# Patient Record
Sex: Male | Born: 1960 | Race: White | Hispanic: No | Smoking: Current every day smoker
Health system: Southern US, Community
[De-identification: ages and names within clinical notes are randomized; demographics above are authoritative.]

## PROBLEM LIST (undated history)

## (undated) DIAGNOSIS — F1910 Other psychoactive substance abuse, uncomplicated: Secondary | ICD-10-CM

## (undated) DIAGNOSIS — R296 Repeated falls: Secondary | ICD-10-CM

## (undated) DIAGNOSIS — S42202B Unspecified fracture of upper end of left humerus, initial encounter for open fracture: Secondary | ICD-10-CM

## (undated) DIAGNOSIS — K219 Gastro-esophageal reflux disease without esophagitis: Secondary | ICD-10-CM

## (undated) DIAGNOSIS — F329 Major depressive disorder, single episode, unspecified: Secondary | ICD-10-CM

## (undated) DIAGNOSIS — F32A Depression, unspecified: Secondary | ICD-10-CM

## (undated) DIAGNOSIS — S42209A Unspecified fracture of upper end of unspecified humerus, initial encounter for closed fracture: Secondary | ICD-10-CM

## (undated) DIAGNOSIS — Z8719 Personal history of other diseases of the digestive system: Secondary | ICD-10-CM

## (undated) DIAGNOSIS — R51 Headache: Secondary | ICD-10-CM

## (undated) DIAGNOSIS — R42 Dizziness and giddiness: Secondary | ICD-10-CM

## (undated) DIAGNOSIS — J189 Pneumonia, unspecified organism: Secondary | ICD-10-CM

## (undated) DIAGNOSIS — S92001A Unspecified fracture of right calcaneus, initial encounter for closed fracture: Secondary | ICD-10-CM

## (undated) DIAGNOSIS — G4700 Insomnia, unspecified: Secondary | ICD-10-CM

## (undated) DIAGNOSIS — T148XXA Other injury of unspecified body region, initial encounter: Secondary | ICD-10-CM

## (undated) DIAGNOSIS — R410 Disorientation, unspecified: Secondary | ICD-10-CM

## (undated) DIAGNOSIS — F419 Anxiety disorder, unspecified: Secondary | ICD-10-CM

## (undated) DIAGNOSIS — N200 Calculus of kidney: Secondary | ICD-10-CM

## (undated) DIAGNOSIS — K922 Gastrointestinal hemorrhage, unspecified: Secondary | ICD-10-CM

## (undated) DIAGNOSIS — M869 Osteomyelitis, unspecified: Secondary | ICD-10-CM

## (undated) HISTORY — DX: Unspecified fracture of upper end of left humerus, initial encounter for open fracture: S42.202B

## (undated) HISTORY — PX: HEEL SPUR SURGERY: SHX665

## (undated) HISTORY — DX: Unspecified fracture of upper end of unspecified humerus, initial encounter for closed fracture: S42.209A

## (undated) HISTORY — DX: Personal history of other diseases of the digestive system: Z87.19

## (undated) HISTORY — DX: Gastrointestinal hemorrhage, unspecified: K92.2

## (undated) HISTORY — DX: Other psychoactive substance abuse, uncomplicated: F19.10

## (undated) HISTORY — DX: Depression, unspecified: F32.A

## (undated) HISTORY — PX: SHOULDER SURGERY: SHX246

## (undated) HISTORY — DX: Repeated falls: R29.6

## (undated) HISTORY — DX: Major depressive disorder, single episode, unspecified: F32.9

---

## 2001-11-07 ENCOUNTER — Inpatient Hospital Stay (HOSPITAL_COMMUNITY): Admission: EM | Admit: 2001-11-07 | Discharge: 2001-11-12 | Payer: Self-pay | Admitting: Psychiatry

## 2002-07-01 ENCOUNTER — Inpatient Hospital Stay (HOSPITAL_COMMUNITY): Admission: EM | Admit: 2002-07-01 | Discharge: 2002-07-05 | Payer: Self-pay | Admitting: Psychiatry

## 2005-04-12 ENCOUNTER — Ambulatory Visit: Payer: Self-pay | Admitting: Psychiatry

## 2005-04-12 ENCOUNTER — Inpatient Hospital Stay (HOSPITAL_COMMUNITY): Admission: EM | Admit: 2005-04-12 | Discharge: 2005-04-17 | Payer: Self-pay | Admitting: Psychiatry

## 2005-04-15 ENCOUNTER — Ambulatory Visit: Payer: Self-pay | Admitting: Psychiatry

## 2005-04-22 ENCOUNTER — Inpatient Hospital Stay (HOSPITAL_COMMUNITY): Admission: EM | Admit: 2005-04-22 | Discharge: 2005-04-27 | Payer: Self-pay | Admitting: *Deleted

## 2005-04-22 ENCOUNTER — Emergency Department (HOSPITAL_COMMUNITY): Admission: EM | Admit: 2005-04-22 | Discharge: 2005-04-22 | Payer: Self-pay | Admitting: Emergency Medicine

## 2005-07-24 ENCOUNTER — Inpatient Hospital Stay (HOSPITAL_COMMUNITY): Admission: EM | Admit: 2005-07-24 | Discharge: 2005-07-27 | Payer: Self-pay | Admitting: Emergency Medicine

## 2005-07-29 ENCOUNTER — Inpatient Hospital Stay (HOSPITAL_COMMUNITY): Admission: RE | Admit: 2005-07-29 | Discharge: 2005-08-03 | Payer: Self-pay | Admitting: Psychiatry

## 2005-07-29 ENCOUNTER — Emergency Department (HOSPITAL_COMMUNITY): Admission: EM | Admit: 2005-07-29 | Discharge: 2005-07-29 | Payer: Self-pay | Admitting: Emergency Medicine

## 2005-07-30 ENCOUNTER — Ambulatory Visit: Payer: Self-pay | Admitting: Psychiatry

## 2005-08-09 ENCOUNTER — Inpatient Hospital Stay (HOSPITAL_COMMUNITY): Admission: EM | Admit: 2005-08-09 | Discharge: 2005-08-11 | Payer: Self-pay | Admitting: *Deleted

## 2009-02-04 ENCOUNTER — Ambulatory Visit (HOSPITAL_BASED_OUTPATIENT_CLINIC_OR_DEPARTMENT_OTHER): Admission: RE | Admit: 2009-02-04 | Discharge: 2009-02-04 | Payer: Self-pay | Admitting: Orthopedic Surgery

## 2009-02-04 ENCOUNTER — Observation Stay (HOSPITAL_COMMUNITY): Admission: EM | Admit: 2009-02-04 | Discharge: 2009-02-05 | Payer: Self-pay | Admitting: Orthopedic Surgery

## 2009-02-22 ENCOUNTER — Inpatient Hospital Stay (HOSPITAL_COMMUNITY): Admission: RE | Admit: 2009-02-22 | Discharge: 2009-02-24 | Payer: Self-pay | Admitting: Orthopedic Surgery

## 2010-04-03 LAB — TYPE AND SCREEN
ABO/RH(D): B NEG
Antibody Screen: NEGATIVE

## 2010-04-03 LAB — COMPREHENSIVE METABOLIC PANEL
BUN: 2 mg/dL — ABNORMAL LOW (ref 6–23)
CO2: 33 mEq/L — ABNORMAL HIGH (ref 19–32)
Calcium: 8 mg/dL — ABNORMAL LOW (ref 8.4–10.5)
Creatinine, Ser: 0.72 mg/dL (ref 0.4–1.5)
GFR calc Af Amer: >60 mL/min (ref >60)
GFR calc non Af Amer: >60 mL/min (ref >60)
Glucose, Bld: 110 mg/dL — ABNORMAL HIGH (ref 70–99)
Total Protein: 5.1 g/dL — ABNORMAL LOW (ref 6.0–8.3)

## 2010-04-03 LAB — POCT I-STAT, CHEM 8
BUN: 17 mg/dL (ref 6–23)
Calcium, Ion: 1.08 mmol/L — ABNORMAL LOW (ref 1.12–1.32)
HCT: 37 % — ABNORMAL LOW (ref 39.0–52.0)
Sodium: 138 mEq/L (ref 135–145)
TCO2: 31 mmol/L (ref 0–100)

## 2010-04-03 LAB — CBC
Hemoglobin: 10.5 g/dL — ABNORMAL LOW (ref 13.0–17.0)
MCHC: 34.4 g/dL (ref 30.0–36.0)
MCV: 93.4 fL (ref 78.0–100.0)
RBC: 3.26 MIL/uL — ABNORMAL LOW (ref 4.22–5.81)

## 2010-04-03 LAB — ABO/RH: ABO/RH(D): B NEG

## 2010-04-06 LAB — TYPE AND SCREEN: Antibody Screen: NEGATIVE

## 2010-04-06 LAB — POCT I-STAT 4, (NA,K, GLUC, HGB,HCT)
Glucose, Bld: 104 mg/dL — ABNORMAL HIGH (ref 70–99)
Hemoglobin: 10.2 g/dL — ABNORMAL LOW (ref 13.0–17.0)
Hemoglobin: 8.8 g/dL — ABNORMAL LOW (ref 13.0–17.0)
Potassium: 3.4 mEq/L — ABNORMAL LOW (ref 3.5–5.1)
Potassium: 3.5 mEq/L (ref 3.5–5.1)
Sodium: 137 mEq/L (ref 135–145)

## 2010-04-06 LAB — COMPREHENSIVE METABOLIC PANEL
CO2: 27 mEq/L (ref 19–32)
Calcium: 9.1 mg/dL (ref 8.4–10.5)
Creatinine, Ser: 0.89 mg/dL (ref 0.4–1.5)
GFR calc Af Amer: >60 mL/min (ref >60)
GFR calc non Af Amer: >60 mL/min (ref >60)
Glucose, Bld: 100 mg/dL — ABNORMAL HIGH (ref 70–99)
Sodium: 138 mEq/L (ref 135–145)
Total Protein: 6.6 g/dL (ref 6.0–8.3)

## 2010-04-06 LAB — CBC
MCHC: 34.9 g/dL (ref 30.0–36.0)
Platelets: 500 10*3/uL — ABNORMAL HIGH (ref 150–400)
RBC: 3.53 MIL/uL — ABNORMAL LOW (ref 4.22–5.81)
RDW: 17.1 % — ABNORMAL HIGH (ref 11.5–15.5)
WBC: 5.5 10*3/uL (ref 4.0–10.5)

## 2010-04-06 LAB — PROTIME-INR
INR: 1.1 (ref 0.00–1.49)
Prothrombin Time: 14.1 seconds (ref 11.6–15.2)

## 2010-04-06 LAB — APTT: aPTT: 49 seconds — ABNORMAL HIGH (ref 24–37)

## 2010-06-02 NOTE — Discharge Summary (Signed)
NAMEISAISH, [name] NO.:  [id number]   MEDICAL RECORD NO.:  [id number]          PATIENT TYPE:  IPS   LOCATION:  0305                          FACILITY:  [facility]   PHYSICIAN:  Jasmine Pang, M.D. DATE OF BIRTH:  [birth year]   DATE OF ADMISSION:  04/22/2005  DATE OF DISCHARGE:  04/27/2005                                 DISCHARGE SUMMARY   IDENTIFICATION:  50 year old single Caucasian male who was admitted on a  voluntary basis.   HISTORY OF PRESENT ILLNESS:  The patient had previously been detoxed and was  discharged.  When home, he resumed drinking starting April 19, 2005.  He has  been depressed and anxious in addition to the alcohol abuse.  He had a  bottle of Xanax and was using these as well.  He states he feels he needs  help due to his anxiety.  He has been having thought to hang himself on a  basketball goal at home.  He has also been having thoughts of shooting  himself.  There have been no previous attempts.   PAST PSYCHIATRIC HISTORY:  Second [facility] admission, did not complete followup  after just being discharged from this unit.  He was supposed to be admitted  to [facility].   SUBSTANCE ABUSE HISTORY:  Drinking alcohol for many years, history of  abusing Xanax, pattern unclear. For further psychiatric admission  information see psychiatric admit note.   PHYSICAL EXAMINATION:  This was done at [facility] and was grossly  within normal limits..   ADMISSION LABORATORIES:  CBC was grossly within normal limits except for a  slightly elevated platelet count of 405 (150 to 400).  Urine drug screen was  positive for benzodiazepines.  Blood alcohol level was 161 (0-10).  Basic  chem panel was grossly within normal limits.  Potassium was slightly  decreased at 3.1 (3.5-5.1).  A repeat potassium was 3.9.  RPR was  nonreactive.  TSH was grossly within normal limits.  Hepatic profile was  grossly within normal limits.   HOSPITAL COURSE:  Upon admission, the  patient was restarted on the low-dose  Librium protocol.  He was also started on Protonix 40 mg daily on April 23, 2005.  On April 23, 2005 he was begun on K-Dur 20 mEq b.i.d. x2 days.  The  repeat potassium level was within normal limits. At 3.9.  On April 24, 2005,  Ambien which had been 10 mg on admission was increased to 20 mg due to  continued difficulty sleeping.   The patient was compliant with all unit groups and therapies and activities.  He continued to be very anxious.  He stated he wanted to be in treatment but  he was worried he would lose his job.  His sponsor was coming to visit him  while he was in the hospital.  He was anxious that I fill out the [facility]  papers for him.  He continued to be anxious throughout the hospitalization  however did well on his detox.  There were some symptoms of withdrawal,  including feeling shaky inside,  but overall he was able to have minimal  symptoms.  He talked about his plans to go home to live with his mother once  he left.  On the day of discharge, his mental status had improved and he was  less depressed and less anxious.  He was not suicidal or homicidal.  He was  having no auditory or visual hallucinations, no paranoia, no delusions.  His  thought processes were logical and goal directed.  Thought content no  predominant theme.  He was felt to be safe to be transitioned home to an  outpatient program.   DISCHARGE DIAGNOSES:  AXIS I:  Depressive disorder not otherwise specified,  alcohol dependence, benzodiazepine dependence.  AXIS II:  None.  AXIS III:  Gastroesophageal reflux disease.  AXIS IV:  Unable to work at present which is very distressing for him.  AXIS V:  Global assessment of functioning upon admission was 25, global  assessment of functioning upon discharge was 55, global assessment of  functioning highest past year was 68.   DISCHARGE INSTRUCTIONS:  Three were no specific activity level or dietary  restrictions.    DISCHARGE MEDICATIONS:  1.  Vistaril 50 mg q.6h p.r.n. anxiety (he states this has been very helpful      for him in the past).  2.  Ambien 10 mg 1-2 pills at bedtime.Marland Kitchen   POST HOSPITAL CARE PLAN:  The patient will return to the outpatient [facility]  treatment in [facility] that was arranged for him on the last  discharge.      Jasmine Pang, M.D.  Electronically Signed     BHS/MEDQ  D:  04/27/2005  T:  04/27/2005  Job:  [id number]

## 2010-06-02 NOTE — H&P (Signed)
NAME:  [name], [name]                    ACCOUNT NO.:  [id number]   MEDICAL RECORD NO.:  [id number]                   PATIENT TYPE:  IPS   LOCATION:  0301                                 FACILITY:  [facility]   PHYSICIAN:  Jeanice Lim, M.D.              DATE OF BIRTH:  April 06, 1960   DATE OF ADMISSION:  07/01/2002  DATE OF DISCHARGE:  07/05/2002                         PSYCHIATRIC ADMISSION ASSESSMENT   IDENTIFYING INFORMATION:  The patient is a 50 year old divorced white male  involuntarily committed on July 01, 2002.   HISTORY OF PRESENT ILLNESS:  The patient was admitted to [facility].  He was unable to stop drinking.  He was having withdrawal symptoms.  His last drink was on the day prior to this admission.  He had been on an  alcohol binge for the past two days, feeling very depressed.  He does not  want to live.  He feels very irritable.  He quit working.  He states he has  lost everything.  He denies any suicide attempts in the past.  He denies any  auditory and visual hallucinations.  He states that he will leave our  facility if he cannot get what he needs.   PAST PSYCHIATRIC HISTORY:  He was admitted at [facility] for  detoxification in January 2004.  He sees Hinton Lovely, M.D., at mental  health for outpatient treatment.   SUBSTANCE ABUSE HISTORY:  He smokes to packs a day.  He has been two to  three pints of alcohol daily.  Last drink was on 07/01/02.   PAST MEDICAL HISTORY:  Primary care Masao Junker: None.  Medical problems: None.   MEDICATIONS:  He has been on Lexapro 10 mg daily.   DRUG ALLERGIES:  No known allergies.   PHYSICAL EXAMINATION:  GENERAL:  Physical examination was done at [facility].  He appears in no acute distress today.   LABORATORY DATA:  CBC: RBC is mildly low at 4.39, hematocrit is 41.5, MCV  95.  Urine drug screen was negative.  CMET: Glucose 72.  Alcohol level was  0.26.   SOCIAL HISTORY:  He is a  50 year old divorced white male, married first  time.  He has two sons ages 63 and 75.  He lives with his children his wife.  He recently quit his work.  He would not elaborate as to why that is.  He  denies any legal issues.   FAMILY HISTORY:  None.   MENTAL STATUS EXAM:  He is an alert, passively cooperative.  The patient had  a sudden need to leave the interview room for a short time but returned  shortly after he left.  He dresses casually.  He has a good eye contact.  Speech is clear.  The patient feels very anxious.  He appears very  irritable.  Thought processes are coherent.  There is no evidence of  psychosis, no auditory hallucinations.  He does  not appear to be responding  to internal stimuli.  Cognitive and memory are intact.  Poor judgment.  Poor  impulse control.  Limited to poor insight.    ADMISSION DIAGNOSES:   AXIS I:  1. Alcohol dependence.  2. Opiate abuse, in partial remission.  3. Depressive disorder, not otherwise specified.   AXIS II:  Deferred.   AXIS III:  None.   AXIS IV:  Problems with primary support group, occupation, economic, other  psychosocial problems.   AXIS V:  Current is 35, this past year is 71.   INITIAL PLAN OF CARE:  Plan is a involuntary commitment to [facility] for alcohol dependence.  Contract for safety, check every 15 minutes.  Will initiate the detoxification protocol.  Continue an antidepressant,  increase dosage to decrease depressive symptoms.  The patient is to attend  groups, hoping to increase his coping skills.  Case worker is to look at  rehabilitation programs.  The patient is to attend AA meetings for continued  support and follow up with mental health.  The patient is to be medication  compliant.  Our long-term goal is for the patient to remain alcohol and drug  free.   ESTIMATED LENGTH OF STAY:  Three to five days.     Landry Corporal, N.P.                       Jeanice Lim, M.D.    JO/MEDQ   D:  07/27/2002  T:  07/27/2002  Job:  [id number]

## 2010-06-02 NOTE — H&P (Signed)
[name], [name] NO.:  [id number]   MEDICAL RECORD NO.:  [id number]          PATIENT TYPE:  IPS   LOCATION:  0502                          FACILITY:  [facility]   PHYSICIAN:  Jasmine Pang, M.D. DATE OF BIRTH:  11-Apr-1960   DATE OF ADMISSION:  08/09/2005  DATE OF DISCHARGE:                         PSYCHIATRIC ADMISSION ASSESSMENT   IDENTIFYING INFORMATION:  A 50 year old divorced white male, involuntarily  committed on August 08, 2005.   HISTORY OF PRESENT ILLNESS:  The patient presents with a history of alcohol  dependence.  The patient was just discharged from [facility] less  than a week ago.  He states when he went home he found out he lost his job,  he became depressed and began drinking again.  The patient has a long  history of drinking, has been drinking since the age 59.  He has been  drinking a case of beer daily.  The patient is here on petition.  The  patient was petitioned per his mother.  The patient also reports a history  of mood swings and was noncompliant with his medications.  The patient  states that he could benefit from going to a long term rehab program.  The  patient reports depression but denies any suicidal thoughts and denies any  psychotic symptoms.   PAST PSYCHIATRIC HISTORY:  The patient has had multiple admissions to  [facility] for alcohol detox.  He is sponsored by [facility]  for 7 days.   SOCIAL HISTORY:  He is a 50 year old divorced white male, lives with his  mother.  He lost his job recently.  Has an 8th grade education.   FAMILY HISTORY:  Denies.   ALCOHOL DRUG HISTORY:  The patient smokes 2 packs a day.  Alcohol habits as  described above.  No seizures.  No drug use.   PAST MEDICAL HISTORY:  Primary care Stuti Sandin is unknown.  Medical problems:  The patient denies any acute or chronic medical problems.   MEDICATIONS:  The patient was discharged on Vistaril and Neurontin.   DRUG ALLERGIES:  No  known allergies.   PHYSICAL EXAMINATION:  The patient was assessed at [facility].  This  is a middle-aged male, complaining of anxiety, appears in no acute distress.  His temperature is 98.4, 105 heart rate, 16 respirations, blood pressure is  145/89, 5 feet 8 inches tall, 137 pounds.  In the emergency room the patient  received IV fluids, Ativan and Valium IV.   LABORATORY DATA:  Urine drug screen is positive for benzodiazepines,  positive for barbiturates.  Alcohol level was 220, BUN was 8.  Liver enzymes  were within normal limits.   MENTAL STATUS EXAM:  He is fully alert, in the bed, good eye contact.  Speech is clear, normal rate and tone.  The patient feels very anxious,  affect is restricted.  The patient is requesting phenobarbital injections  and stating don't leave him alone to die there.  Thought processes and  coherent.  There is no evidence of psychosis.  Cognitive function intact.  Memory is fair, judgment is  poor, insight is fair, poor impulse control.   ADMISSION DIAGNOSES:  AXIS I:  Alcohol dependence, depressive disorder not  otherwise specified.  AXIS II:  Deferred.  AXIS III:  None.  AXIS IV:  Problems with occupation, other psychosocial problems related to  chronic substance abuse.  AXIS V:  Current is 35.   PLAN:  Plan is to detox the patient with Librium protocol, work on relapse  prevention, monitor withdrawal symptoms.  We will resume his Neurontin and  have Vistaril available, also some Seroquel for some mild agitation.  The  patient may consider a mood stabilizer.  The patient reports being on  Depakote in the past.  Case manager to assess any long-term rehab programs  available.   TENTATIVE LENGTH OF CARE:  5-6 days.      Landry Corporal, N.P.      Jasmine Pang, M.D.  Electronically Signed    JO/MEDQ  D:  08/09/2005  T:  08/09/2005  Job:  [id number]

## 2010-06-02 NOTE — Discharge Summary (Signed)
[name], [name] NO.:  [id number]   MEDICAL RECORD NO.:  [id number]          PATIENT TYPE:  INP   LOCATION:  1514                         FACILITY:  [facility]   PHYSICIAN:  Deirdre Peer. Polite, M.D. DATE OF BIRTH:  28-Oct-1960   DATE OF ADMISSION:  07/24/2005  DATE OF DISCHARGE:  07/27/2005                                 DISCHARGE SUMMARY   DISCHARGE DIAGNOSES:  1.  Alcohol intoxication, treated with withdrawal protocol.  Please note      alcohol level 139 on admission.  2.  History of benzodiazepines abuse.  3.  Hypokalemia, resolved.  4.  Reported history of dark emesis and dark stools, hemoccult positive.      Hemoglobin stable throughout hospitalization of 14, no recurrent      episodes of dark emesis or dark stools in the hospital, hemodynamically      stable throughout.  For further outpatient follow up with Dr. Wandalee Ferdinand      of [facility].  Number has been provided to the patient.   DISCHARGE MEDICATIONS:  1.  Librium 25 mg 1 q.12h. x 1 day, then 1 daily x1 day, then stop.  2.  Protonix 40 mg 1 q.12h. daily.  3.  Multivitamin daily.  4.  The patient is advised to avoid NSAIDs.  5.  The patient has been advised to avoid alcohol.  6.  The patient has been advised to call [facility] for outpatient follow up,      reported bloody emesis and stools.   CONSULTANTS:  None.   DISPOSITION:  The patient is being discharged to home in stable condition.  Further outpatient follow up with [facility] and his primary care MD of his  choosing.   DATA:  CBC:  White count 5.7, hemoglobin 14.6, platelets 198, BMET within  normal limits.  Hemoccult positive.  Alcohol level on admission 139.  Urine  drug screen positive for benzodiazepines.   HISTORY OF PRESENT ILLNESS:  A 50 year old male with multiple medical  problems presented to the hospital with chief complaint of abdominal pain,  nausea, vomiting, and bloody emesis of dark-colored material as well as dark  stool.   In the ED, the patient was evaluated.  Admission was deemed  necessary for further evaluation and treatment.  Please see the dictated H&P  for further details.   PAST MEDICAL HISTORY:  As stated above.   MEDICATIONS ON ADMISSION:  Per admission H&P.   SOCIAL HISTORY:  Per admission H&P.   HOSPITAL COURSE:  The patient was admitted to a medicine floor bed for  further evaluation and treatment of EtOH induced gastritis.  The patient  treated with IV fluids, started on withdrawal protocol as well as proton  pump inhibitor.  The patient had serial H&H.  The patient's initial  hemoglobin on admission was 17.  Follow up was 14.  It was felt that the  decrease is secondary to hemodilution effect.  The patient's stools were  monitored during his hospitalization and did not have any bloody stools.  Stools were hemoccult positive.  There was  no emesis.  The patient was  tolerating a full p.o. diet without recurrent emesis or abdominal pain.  On  July 13, the patient continued to have a stable hemoglobin of 14 without  signs of withdrawal.  The patient was felt to be stable for discharge.  [facility] has been contacted and will have outpatient follow up with the  patient in reference to the reported history of dark emesis and heme-  positive stools.  At this time, this is felt to be EtOH induced gastritis.      Deirdre Peer. Polite, M.D.  Electronically Signed     RDP/MEDQ  D:  07/27/2005  T:  07/27/2005  Job:  [id number]   cc:   Graylin Shiver, M.D.  Fax: [phone]

## 2010-06-02 NOTE — Discharge Summary (Signed)
NAMEKEYLAN, COSTABILE NO.:  [id number]   MEDICAL RECORD NO.:  [id number]          PATIENT TYPE:  IPS   LOCATION:  0502                          FACILITY:  [facility]   PHYSICIAN:  Geoffery Lyons, M.D.      DATE OF BIRTH:  1960/03/05   DATE OF ADMISSION:  07/29/2005  DATE OF DISCHARGE:  08/03/2005                                 DISCHARGE SUMMARY   CHIEF COMPLAINT/PRESENT ILLNESS:  This was one of several admissions to  [facility] for this 50 year old single male, voluntarily  admitted, with a history of alcohol dependence, drinking heavily for the  last two weeks.  He was recently discharged from the hospital for lower GI  bleeding (discharged July 13), drinking too much liquor, last drink July  15.  He was using Xanax to help control his anxiety.   PAST PSYCHIATRIC HISTORY:  Prior detox.  Longest history of sobriety three  months.  Alcohol and/or drug history:  Persistent use of alcohol as already  stated, more recently using Xanax.   MEDICAL HISTORY:  Noncontributory other than for lower GI bleeding.   MEDICATIONS:  None prescribed.   PHYSICAL EXAMINATION:  Performed, failed to show any acute findings.   LABORATORY DATA:  On laboratory workups, blood chemistry:  Sodium 140,  potassium 4.1, glucose 81, BUN 17, creatinine 1.1.  CBC:  White blood cells  7.4, hemoglobin 15.1.  Drug screening:  Benzodiazepines positive.   MENTAL STATUS EXAM:  Upon admission revealed an alert, cooperative male in  no acute distress, in bed, somewhat uncooperative.  Fair eye contact.  Speech normal rate, tone, and production.  Mood:  Feeling shaky.  Affect  constricted.  Thought process logical, coherent and relevant, somatically  focused, and there are no acute suicidal or homicidal ideas.  No evidence of  delusions.  No hallucinations.  Cognition well preserved.   ADMISSION DIAGNOSIS:  AXIS I:  Alcohol dependence, anxiety disorder not  otherwise specified.  AXIS II:   No diagnosis.  AXIS III:  No diagnosis.  AXIS IV:  Moderate.  AXIS V:  Upon admission 35, highest global assessment of functioning in the  last year 60.   COURSE IN THE HOSPITAL:  He was admitted.  He was started in milieu and  psychotherapy.  He initially endorsed that detox, he does better with  phenobarbital.  Phenobarbital had to be supplemented with Librium.  He  continuously complained of being shaky.  The clinical call was between  anxiety and actual withdrawal.  We worked with Neurontin.  We worked with  Vistaril.  Eventually we established Neurontin at 300 three times a day.  He  did endorse that he relapsed after almost there to four weeks of sobriety.  He cannot say why, maybe some issues having to do with other people, places.  Very vague, very reserved, non-spontaneously.  He claimed shakiness.  He  requested going to a longer term treatment facility, and there were some  headaches.  We continued to detox, continued to have difficulty with  shakiness.  Objectively, his blood pressure and pulse  were within normal  limits.  The focus was wanting to go to a residential treatment center.   We continued to detox with phenobarbital and Librium.  We continued to  address the anxiety, helped him with some Neurontin as well as Vistaril.   By July 19, he understood he was not going to be able to make it to a  residential treatment center.  He was going to have to go back to work.  He  was willing to try the Neurontin and give the Neurontin a longer time as he  had seen some benefit.  On July 20, he was in full contact with reality.  There were no suicidal or homicidal ideas, no hallucinations, no delusions.  Objectively he was better, decrease in anxiety, and it was noted he was  committed to abstinence.   DISCHARGE DIAGNOSIS:  AXIS I:  Alcohol dependence; anxiety disorder not  otherwise specified.  AXIS II:  No diagnosis.  AXIS III:  No diagnose.  AXIS IV:  Moderate.  AXIS V:   Upon discharge, 50.   DISCHARGE MEDICATIONS:  He was discharged on:  1. Protonix 40 mg per day.  2. Neurontin 300 three times a day.  3. Vistaril 25, one every six hours as needed for anxiety.   FOLLOWUP:  At [facility].      Geoffery Lyons, M.D.  Electronically Signed     IL/MEDQ  D:  08/25/2005  T:  08/26/2005  Job:  [id number]

## 2010-06-02 NOTE — Discharge Summary (Signed)
NAME:  [name], [name]                    ACCOUNT NO.:  [id number]   MEDICAL RECORD NO.:  [id number]                   PATIENT TYPE:  IPS   LOCATION:  0301                                 FACILITY:  [facility]   PHYSICIAN:  Jeanice Lim, M.D.              DATE OF BIRTH:  December 01, 1960   DATE OF ADMISSION:  07/01/2002  DATE OF DISCHARGE:  07/05/2002                                 DISCHARGE SUMMARY   IDENTIFYING DATA:  This is a 50 year old divorced Caucasian male  involuntarily admitted reporting that he cannot stop drinking, had been  binging and had lost everything.  Had been using other drugs.  Was not a  reliable historian.   PAST PSYCHIATRIC HISTORY:  Had been in several other hospitals in the last  two months for detox.   MEDICATIONS:  The patient, reportedly, was supposed to take Lexapro 10 mg  daily but had been noncompliant.   ALLERGIES:  No known drug allergies.   PHYSICAL EXAMINATION:  Essentially within normal limits.  Neurologically  nonfocal.   LABORATORY DATA:  Urine drug screen negative.  Alcohol level 0.26.   MENTAL STATUS EXAM:  Alert and passively cooperative with sudden need to  leave interview.  Good eye contact.  Speech clear.  Mood anxious, irritable.  Thought processes goal directed.  Thought content negative for dangerous  ideation or psychotic symptoms.  Poor impulse control.  Judgment and  insight, patient denied acute dangerous ideation.   ADMISSION DIAGNOSES:   AXIS I:  1. Alcohol dependence.  2. Opiate abuse.  3. Depressive disorder not otherwise specified.   AXIS II:  Deferred.   AXIS III:  None.   AXIS IV:  Moderate (problems with primary support group).   AXIS V:  35/60.   HOSPITAL COURSE:  The patient was admitted and ordered routine p.r.n.  medications and underwent further monitoring.  Was encouraged to participate  in individual, group and milieu therapy.  The patient was placed on a detox  protocol and monitored.  The patient  was somewhat med-seeking, quite  agitated, threatening at times.  Wanted detox protocol changed to Librium.  Felt phenobarbital was not working.  Continued to be somewhat threatening  and would not follow hospital safety rules including not smoking in the  room.  The patient was warned multiple times, continued to smoke, warned he  would be discharged if he continued to smoke.  On July 05, 2002, reported no  suicidal ideation, homicidal ideation, no psychotic symptoms, positive mild  withdrawal symptoms which are improving and some craving but no acute safety  issues.  The patient was later discharged that evening, when he continued to  smoke in his room despite safety warnings.  The patient was discharged at  that time due to noncompliance with treatment, lack of further indication  for inpatient treatment, not cooperative with program.  No acute safety  issues regarding suicidal or homicidal ideation or psychotic symptoms.  CONDITION ON DISCHARGE:  The patient was discharged in improved condition  and was actually happy to leave.   DISCHARGE MEDICATIONS:  1. Lexapro 20 mg q.a.m.  2. Seroquel 25 mg b.i.d., 100 mg q.h.s.  3. Protonix 40 mg q.a.m.  4. Neurontin 200 mg q.i.d.   FOLLOW UP:  The patient is to follow up at [facility]  on Wednesday, July 08, 2002 at 10:15 a.m.   DISCHARGE DIAGNOSES:   AXIS I:  1. Alcohol dependence.  2. Opiate abuse.  3. Depressive disorder not otherwise specified.   AXIS II:  Deferred.   AXIS III:  None.   AXIS IV:  Moderate (problems with primary support group).   AXIS V:  Global Assessment of Functioning on discharge 55.                                               Jeanice Lim, M.D.    JEM/MEDQ  D:  07/29/2002  T:  07/30/2002  Job:  [id number]

## 2010-06-02 NOTE — H&P (Signed)
NAMECHRISTIAN, [name] NO.:  [id number]   MEDICAL RECORD NO.:  [id number]          PATIENT TYPE:  IPS   LOCATION:  0305                          FACILITY:  [facility]   PHYSICIAN:  Anselm Jungling, MD  DATE OF BIRTH:  09/25/1960   DATE OF ADMISSION:  04/12/2005  DATE OF DISCHARGE:                         PSYCHIATRIC ADMISSION ASSESSMENT   IDENTIFYING INFORMATION:  A 50 year old divorced white male, voluntarily  admitted on April 12, 2005.   HISTORY OF PRESENT ILLNESS:  The patient presents with a history of alcohol  abuse.  He states he drinks every day, drinking vodka and beer, up to a pint  or more or 6-pack or more.  He has been drinking since 50 years of age.  His  longest history of sobriety has been 2 years.  The patient states he  relapsed about 2 weeks ago.  He has been drinking in the morning.  Denies  any blackouts, denies any depression or suicidal thoughts.  He reports that  he has had some falls.  They do not appear to be any recent falls.  Denies  any other drug use.   PAST PSYCHIATRIC HISTORY:  The patient was here a few years ago.  Denies any  suicide attempts or current outpatient mental health treatment.   SOCIAL HISTORY:  He is a 50 year old divorced white male and has 2 children,  ages 80 and 34.  He lives with his mother.  The patient states he works,  occupation is unclear.   FAMILY HISTORY:  Denies.   ALCOHOL DRUG HISTORY:  The patient smokes.  He denies any seizures, denies  any drug use.  Alcohol habits as described above.   PAST MEDICAL HISTORY:  Primary care Melroy Bougher is Dr. Jodelle Gross in [place].  Medical problems are none.   MEDICATIONS:  None.   DRUG ALLERGIES:  No known allergies.   PHYSICAL EXAMINATION:  The patient was physically assessed at [facility] where he received Ativan and some IV fluids.  He does present with  a bruise to his right forearm.  He is a thin, unkempt male.  His review of  systems:  No fever, no  chills, no nausea and vomiting, no insomnia, some  reported weight loss, no dysuria, positive for falls.  No seizures, no  blurred vision, positive for bruising.  Temperature is 96.8, pulse of 116,  respiratory rate of 16, blood pressure 141/78.  He is 97% saturated.  RBCs  is 4.9.  MCV is 99, BUN is 5, alcohol level was 250 on arrival to emergency  room.  Drug screen was negative.   MENTAL STATUS EXAM:  He is a fully alert, anxious appearing middle-aged  male.  He is somewhat unkempt.  Speech is clear, although he provides little  information.  The patient feels anxious, patient appears anxious as well.  The patient did get almost before interview was over, walked out without any  explanation.  Thought processes are coherent, no evidence of any psychosis,  cognitive function intact.  Memory is fair, judgment is fair, poor impulse  control.  He is a  poor historian.   ADMISSION DIAGNOSES:  AXIS I:  Alcohol abuse.  AXIS II:  Deferred.  AXIS III:  None.  AXIS IV:  Psychosocial problems related to chronic alcohol use.  AXIS V:  Current is 45.   PLAN:  Plan is to detox with low-dose Librium, offer continuous fluids,  monitor withdrawal symptoms.  Case manager is to look at any potential rehab  program available. The patient is to increase coping skills and attend group  therapy.   TENTATIVE LENGTH OF CARE:  4-5 days.      Landry Corporal, N.P.      Anselm Jungling, MD  Electronically Signed    JO/MEDQ  D:  04/13/2005  T:  04/15/2005  Job:  [id number]

## 2010-06-02 NOTE — Discharge Summary (Signed)
NAMEJERET, [name] NO.:  [id number]   MEDICAL RECORD NO.:  [id number]          PATIENT TYPE:  IPS   LOCATION:  0502                          FACILITY:  [facility]   PHYSICIAN:  Jasmine Pang, M.D. DATE OF BIRTH:  July 10, 1960   DATE OF ADMISSION:  08/08/2005  DATE OF DISCHARGE:  08/11/2005                                 DISCHARGE SUMMARY   IDENTIFICATION:  This is a 50 year old divorced Caucasian male who was  admitted on an involuntary basis to my service on August 08, 2005.   HISTORY OF PRESENT ILLNESS:  The patient presents with a history of alcohol  dependence.  He was just discharged from [facility] less than a week  ago.  He states that he went home and found out that he lost his job.  He  became depressed and began drinking again.  The patient has a long history  of drinking since the age of 15.  He drinks a case of beer daily.  The  patient is here on petition.  He was petitioned by his mother who was  worried about his mental health.  The patient also reports mood swings and  noncompliance with his medications.  He states that he could benefit from  going into long-term rehab.  He reports depression but denies any suicidal  thoughts.  Denies any psychotic symptoms.   PAST PSYCHIATRIC HISTORY:  The patient has had multiple admissions to  [facility] for alcohol detox.   FAMILY HISTORY:  Denies.   ALCOHOL/DRUG HISTORY:  The patient smokes two packs per day.  Alcohol habits  as described above.  No seizures.  No drug use.   PAST MEDICAL HISTORY:  The patient denies any acute or chronic medical  problems.   MEDICATIONS:  The patient was discharged on Vistaril and Neurontin.   ALLERGIES:  No known drug allergies.   PHYSICAL EXAMINATION:  The patient was assessed at [facility]  Emergency Department.  He was a middle-aged male complaining of anxiety,  appearing in acute distress.  His temperature was 98.4, heart rate 105,  respirations 16, pulse 148/95, height 5 feet tall.  He weighed 137 pounds.  In the emergency room, the patient received IV fluids, Ativan and Valium.   LABORATORY DATA:  Urine drug screen is positive for benzodiazepines and  positive for barbiturates.  Alcohol level was 220.  BUN 8.  Liver enzymes  were within normal limits.  RPR done through the [facility] was  reactive.  The patient had to be given penicillin G benzathine 2.4 million  units IM x1.   HOSPITAL COURSE:  Upon admission, the patient was placed on the Librium  detox protocol.  He was also placed on Ambien 10 mg p.o. q.h.s. p.r.n.  insomnia.  On August 09, 2005, the patient was placed on Neurontin 100 mg p.o.  t.i.d., Seroquel 25 mg p.o. q.6h. p.r.n.  Was advised that nursing staff  should encourage fluids.  The patient was also placed on Vistaril 50 mg p.o.  q.6h. p.r.n. anxiety or agitation.  On August 10, 2005,  the patient was  started on Protonix 40 mg p.o. q.d.  On August 10, 2005, the patient's RPR was  found to be reactive.  He was therefore placed on penicillin G 2.4 millions  units IM x1.  The patient tolerated his medications well with no significant  side effects.   Upon admission, when I first met with patient, he was lying in his bed and  not participating in groups.  He stated he felt too tremulous on the inside  to be able to do that.  He stated he had just gotten out of this hospital  and relapsed after he found out his job was gone.  He got fired because he  had been undependable.  He was also interested in starting lithium  carbonate.  He stated he had not been on this before and he felt it would be  helpful for his mood cycling.  On August 10, 2005, the patient stated he was  not doing well.  He was shaking, tired.  He states he hurts all over.  He  feels the medicine is not working.  He was advised to have to give it more  time.  He was very depressed.  He just wanted to stay in bed.  He wanted   phenobarbital or Librium.  I advised he cannot use these medications on a  regular as-needed basis.  It was also found out that day that his RPR done  at the [facility] Emergency Department was reactive.  Subsequently,  he was given penicillin as indicated in the first paragraph.  On August 11, 2005, the patient felt ready for discharge.  He states he wants help.  He  discussed his lack of follow through in the past.  He understands he will  have to go through [facility] for follow-up.  His mental status had  improved.  The patient was more cooperative and attentive as we talked,  engaging well.  The mood was less depressed, less anxious.  The affect wide  range.  No suicidal or homicidal ideation.  Thoughts logical and goal  directed.  Thought content with no predominant theme.   DISCHARGE DIAGNOSES:  AXIS I:  Alcohol dependence.  Features of major  depression.  AXIS II:  None.  AXIS III:  Positive RPR, treated with a onetime dose of penicillin G.  AXIS IV:  Severe (problems with occupation, other psychosocial problems  related to chronic substance abuse).  AXIS V:  GAF current 45; GAF on admission 35; GAF highest past year 55.   ACTIVITY/DIET:  There were no specific activity level or dietary  restrictions other than the patient needs to eat correctly.   DISCHARGE MEDICATIONS:  1.  Librium 25 mg at bedtime today; 25 mg once daily tomorrow; then      discontinue.  2.  Neurontin 100 mg p.o. t.i.d.  3.  Protonix 40 mg daily.  4.  Trazodone 50 mg, 1-2 pills at bedtime.  5.  Seroquel 25 mg every six hours as needed for anxiety.  6.  Atarax 50 mg, 1 pill every six hours.   POST-HOSPITAL CARE PLANS:  The patient will go to the [facility] for follow-up and this appointment will be arranged by our  casemanager.      Jasmine Pang, M.D.  Electronically Signed     BHS/MEDQ  D:  08/11/2005  T:  08/11/2005  Job:  [id number]

## 2010-06-02 NOTE — Discharge Summary (Signed)
[name], [name] NO.:  [id number]   MEDICAL RECORD NO.:  [id number]                   PATIENT TYPE:  IPS   LOCATION:  0500                                 FACILITY:  [facility]   PHYSICIAN:  Geoffery Lyons, M.D.                   DATE OF BIRTH:  1960/12/11   DATE OF ADMISSION:  11/07/2001  DATE OF DISCHARGE:  11/12/2001                                 DISCHARGE SUMMARY   CHIEF COMPLAINT AND PRESENT ILLNESS:  This was the first admission to [facility] for this 50 year old divorced white male  involuntarily committed.  He comes ________________________ himself.  Reports urine drug screen was positive at work.  He came to get himself  straight.  He started relapsing on alcohol last week, drinking one pint or  more per day.  No history of blackouts or seizures.  Had been using  marijuana, cocaine, benzodiazepines and Vicodin on the weekend.  No specific  stressors.  Distraught over losing his job.   PAST PSYCHIATRIC HISTORY:  First time at [facility].  No history of a  suicide attempt.  History of a detox in 2003.  Attending [facility].   ALCOHOL/DRUG HISTORY:  Relapsed on Tuesday with alcohol, drinking about a  pint or more per day.  No history of blackouts.  Has been using marijuana,  cocaine, benzodiazepines and Vicodin.   PAST MEDICAL HISTORY:  Noncontributory.   MEDICATIONS:  Lexapro 10 mg daily, Depakote 250 mg twice a day.   PHYSICAL EXAMINATION:  Performed and failed to show any acute findings.   MENTAL STATUS EXAM:  Sleepy but alert, cooperative male.  Little eye  contact.  Speech is clear when awake.  Mood is depressed and affect is  irritable.  Thought process are coherent.  No evidence of psychosis.  No  auditory or visual hallucinations.  No paranoia.  Cognition well-preserved.   ADMISSION DIAGNOSES:   AXIS I:  1. Depressive disorder not otherwise specified.  2. Polysubstance  dependence.   AXIS II:  No diagnosis.   AXIS III:  No diagnosis.   AXIS IV:  Moderate.   AXIS V:  Global Assessment of Functioning upon admission 30; highest Global  Assessment of Functioning in the last year 65.   LABORATORY DATA:  Thyroid profile was within normal limits.  Other  laboratory includes glucose 169, potassium 147.   HOSPITAL COURSE:  He was started intensive individual and group  psychotherapy.  He was detoxified using Librium.  He was given Seroquel 25  mg twice a day and his Depakote was increased to 250 mg a day and the  Seroquel was further increased to every six hours.  He was initially on  Paxil and this was continued.  He was started on Lexapro.  Continued to  endorse depression and difficulty with sleep, so Seroquel was  increased.  Did exhibit some medication-seeking behavior, like something for his nerves.  He found himself a place in [facility].  On November 12, 2001, he was  in full contact with reality, fully detoxed.  No suicidal ideation.  No  homicidal ideation.  He was going to go into [facility], a long-term  program that he was motivated to go to.  As he was not suicidal, not  homicidal and motivated, we discharged to outpatient follow-up.   DISCHARGE DIAGNOSES:   AXIS I:  1. Depressive disorder not otherwise specified.  2. Anxiety disorder not otherwise specified.  3. Polysubstance dependence.   AXIS II:  No diagnosis.   AXIS III:  No diagnosis.   AXIS IV:  Moderate.   AXIS V:  Global Assessment of Functioning upon discharge 55.   DISCHARGE MEDICATIONS:  1. Protonix 40 mg per day.  2. Depakote 250 mg, one in the morning, 2 at night.  3. Lexapro 10 mg daily.  4. Seroquel 50 mg twice a day and at bedtime.  5. Vistaril 50 mg as needed for anxiety.   FOLLOW UP:  [facility].  Was discharged to  [facility].                                               Geoffery Lyons, M.D.    IL/MEDQ  D:   12/15/2001  T:  12/15/2001  Job:  [id number]

## 2010-06-02 NOTE — Discharge Summary (Signed)
[name], REMMEL NO.:  [id number]   MEDICAL RECORD NO.:  [id number]          PATIENT TYPE:  IPS   LOCATION:  0305                          FACILITY:  [facility]   PHYSICIAN:  Anselm Jungling, MD  DATE OF BIRTH:  Aug 04, 1960   DATE OF ADMISSION:  04/12/2005  DATE OF DISCHARGE:  04/17/2005                                 DISCHARGE SUMMARY   IDENTIFYING DATA AND REASON FOR ADMISSION:  This was the first [facility] admission  for [name], a 50 year old white male, admitted for alcohol  detoxification.  He had a long history of alcohol abuse and dependence.  He  had tried to stop drinking on his own unsuccessfully.  He had no known  concurrent psychiatric issues.  Please refer to the admission note for  further details pertaining to the symptoms, circumstances and history that  led to his hospitalization.  He was given an initial Axis I diagnosis of  alcohol dependence.   MEDICAL AND LABORATORY:  The patient was medically assessed at the [facility] Emergency Room where he showed a CBC that was essentially within  normal limits with the exception of increased MCV, consistent with  nutritional effects of chronic alcohol dependence.  Metabolic panel was  within normal limits, including normal liver function tests.  Toxicology  screen was negative.  There were no significant medical issues beyond his  alcohol detoxification.  He was given Protonix 40 mg daily for GERD.   HOSPITAL COURSE:  The patient was admitted to the adult inpatient  psychiatric service where he was placed on a Librium detoxification  protocol.  He had prominent withdrawal symptoms during his stay, often  requiring extra doses of Librium to control these and keep his vital signs  within normal limits.  He generally had a positive attitude towards the need  to complete detoxification, however, uncomfortable he was.   During the course of his inpatient stay, he indicated strong interest in  resuming  Alcoholics Anonymous involvement and 12-step recovery.  He  identified through his church an individual who was to be a sober AA sponsor  for him, and this individual visited him on a daily basis.   By the sixth hospital day, the patient had had completion of his alcohol  detoxification process.  He was still moderately anxious though.  He did not  want to continue with additional doses of Librium.  Inderal 10 mg q.i.d. was  given on a trial basis.  This was successful in moderating his residual  anxiety and tension.   AFTERCARE:  The patient was discharged on April 17, 2005 with the following  medication regimen:  Inderal 10 mg q.i.d., Protonix 40 mg daily, and Ambien  10 mg q.h.s. as needed for insomnia.  He was to follow-up at the [facility] Depend[facility],  beginning Wednesday, April 18, 2005.   DISCHARGE DIAGNOSES:  AXIS I:  Alcohol dependence.  AXIS II:  Deferred.  AXIS III:  History of GERD.  AXIS IV:  Stressors severe.  AXIS V:  GAF on  discharge 70.           ______________________________  Anselm Jungling, MD  Electronically Signed     SPB/MEDQ  D:  04/18/2005  T:  04/19/2005  Job:  [id number]

## 2010-06-02 NOTE — H&P (Signed)
[name], HALIBURTON NO.:  [id number]   MEDICAL RECORD NO.:  [id number]                   PATIENT TYPE:  IPS   LOCATION:  0500                                 FACILITY:  BH   PHYSICIAN:  Geoffery Lyons, M.D.                   DATE OF BIRTH:  [birth year]   DATE OF ADMISSION:  11/07/2001  DATE OF DISCHARGE:  11/12/2001                         PSYCHIATRIC ADMISSION ASSESSMENT   IDENTIFYING INFORMATION:  A 50 year old divorced white male, involuntarily  committed on November 07, 2001.   HISTORY OF PRESENT ILLNESS:  The patient presents with a history of suicidal  ideation, threatening to shoot himself.  The patient reported his urine drug  screen was positive at work.  He came in to get himself straight.  He states  he relapsed on alcohol last week, drinking 1 pint or more per day, no  history of blackouts or seizures.  Has been using marijuana, cocaine,  benzos, and Vicodin over the weekend.  Denies any specific stressors but is  distressed over losing his job.  His sleep and appetite has been increased.  He denies any psychotic symptoms, and is denying any withdrawal symptoms at  the present.   PAST PSYCHIATRIC HISTORY:  First hospitalization at [facility], no history of a suicide attempt.  Has a history of detox in June of  2003, was attending [facility] as an outpatient.   SOCIAL HISTORY:  He is a 51 year old divorced white male, that was his first  marriage.  He presently lives with his mother.  He works Comptroller.  He has a court date pending for a shoplifting charge and he has 3  DUIs in the past.   FAMILY HISTORY:  Unclear.   ALCOHOL DRUG HISTORY:  The patient relapsed on Tuesday with alcohol,  drinking about a pint or more per day.  He has no history of blackouts or  seizures.  He has been buying marijuana, cocaine, benzos, and Vicodin on the  streets.   PAST MEDICAL HISTORY:   Primary care Samson Ralph is none, medical problems are  none.   MEDICATIONS:  He has been on Lexapro 10 mg and Depakote 250 mg b.i.d.  Some  question as to medication compliance.   DRUG ALLERGIES:  None.   PHYSICAL EXAMINATION:  Performed at [facility].  His urine drug  screen was negative.  His RBC count was 4.67, platelet count was 431.  Sodium 147, glucose elevated at 169.  Alcohol level was 180.  Depakote level  is pending.   MENTAL STATUS EXAM:  The patient is in bed.  He is very sleepy, little eye  contact.  He is cooperative, then falls asleep readily.  His speech is clear  when awake.  Mood is depressed and affect is irritable.  Thought processes  are coherent.  There is no evidence of  psychosis, no auditory or visual  hallucinations, no paranoia.  Cognitive function intact.  Memory is intact.  Judgment and insight is poor.    ADMISSION DIAGNOSES:   AXIS I:  1. Depressive disorder not otherwise specified.  2. Polysubstance abuse.   AXIS II:  Deferred.   AXIS III:  Blood in stool.   AXIS IV:  Problems with occupation, other psychosocial problems.   AXIS V:  Current is 30, this past year 71.   PLAN:  Involuntary commitment for suicidal ideation and polysubstance abuse.  Contract for safety, check every 15 minutes.  We will initiate a low-dose  Librium to detox safely from alcohol and some benzo abuse.  We will  encourage fluids, will resume his Depakote, await valporic acid level, and  adjust dosage as needed.  Stabilize mood and thinking so the patient can be  safe.  Patient to remain alcohol and drug free.  Increase coping skills by  attending groups, attend AA meetings, and for patient to be medication  compliant.   TENTATIVE LENGTH OF CARE:  3-5 days.       Landry Corporal, N.P.                       Geoffery Lyons, M.D.    JO/MEDQ  D:  11/11/2001  T:  11/11/2001  Job:  [id number]

## 2010-07-12 ENCOUNTER — Inpatient Hospital Stay (HOSPITAL_COMMUNITY)
Admission: AD | Admit: 2010-07-12 | Discharge: 2010-07-17 | DRG: 493 | Disposition: A | Discharge status: Home / Self Care | Payer: Medicaid Other | Source: Other Acute Inpatient Hospital | Attending: Orthopedic Surgery | Admitting: Orthopedic Surgery

## 2010-07-12 ENCOUNTER — Inpatient Hospital Stay (HOSPITAL_COMMUNITY): Payer: Medicaid Other

## 2010-07-12 DIAGNOSIS — R404 Transient alteration of awareness: Secondary | ICD-10-CM | POA: Diagnosis not present

## 2010-07-12 DIAGNOSIS — F1020 Alcohol dependence, uncomplicated: Secondary | ICD-10-CM | POA: Diagnosis present

## 2010-07-12 DIAGNOSIS — T84049A Periprosthetic fracture around unspecified internal prosthetic joint, initial encounter: Principal | ICD-10-CM | POA: Diagnosis present

## 2010-07-12 DIAGNOSIS — D62 Acute posthemorrhagic anemia: Secondary | ICD-10-CM | POA: Diagnosis not present

## 2010-07-12 DIAGNOSIS — W1789XA Other fall from one level to another, initial encounter: Secondary | ICD-10-CM | POA: Diagnosis present

## 2010-07-12 DIAGNOSIS — K769 Liver disease, unspecified: Secondary | ICD-10-CM | POA: Diagnosis present

## 2010-07-12 DIAGNOSIS — Y998 Other external cause status: Secondary | ICD-10-CM

## 2010-07-12 DIAGNOSIS — I10 Essential (primary) hypertension: Secondary | ICD-10-CM | POA: Diagnosis not present

## 2010-07-12 DIAGNOSIS — Y9239 Other specified sports and athletic area as the place of occurrence of the external cause: Secondary | ICD-10-CM

## 2010-07-12 DIAGNOSIS — R259 Unspecified abnormal involuntary movements: Secondary | ICD-10-CM | POA: Diagnosis not present

## 2010-07-12 DIAGNOSIS — Z96619 Presence of unspecified artificial shoulder joint: Secondary | ICD-10-CM

## 2010-07-12 DIAGNOSIS — Z9660 Presence of unspecified orthopedic joint implant: Principal | ICD-10-CM | POA: Diagnosis present

## 2010-07-13 ENCOUNTER — Inpatient Hospital Stay (HOSPITAL_COMMUNITY): Payer: Medicaid Other

## 2010-07-13 LAB — COMPREHENSIVE METABOLIC PANEL
AST: 28 U/L (ref 0–37)
Albumin: 2.6 g/dL — ABNORMAL LOW (ref 3.5–5.2)
Alkaline Phosphatase: 115 U/L (ref 39–117)
Calcium: 7.1 mg/dL — ABNORMAL LOW (ref 8.4–10.5)
GFR calc Af Amer: >60 mL/min (ref >60)
Sodium: 137 mEq/L (ref 135–145)
Total Bilirubin: 0.2 mg/dL — ABNORMAL LOW (ref 0.3–1.2)
Total Protein: 6.5 g/dL (ref 6.0–8.3)

## 2010-07-13 LAB — CBC
Hemoglobin: 10.9 g/dL — ABNORMAL LOW (ref 13.0–17.0)
MCH: 25.7 pg — ABNORMAL LOW (ref 26.0–34.0)
MCHC: 31.8 g/dL (ref 30.0–36.0)
MCV: 80.9 fL (ref 78.0–100.0)
RBC: 4.24 MIL/uL (ref 4.22–5.81)

## 2010-07-14 ENCOUNTER — Inpatient Hospital Stay (HOSPITAL_COMMUNITY): Payer: Medicaid Other

## 2010-07-14 LAB — POCT I-STAT 4, (NA,K, GLUC, HGB,HCT)
Glucose, Bld: 122 mg/dL — ABNORMAL HIGH (ref 70–99)
HCT: 33 % — ABNORMAL LOW (ref 39.0–52.0)
Hemoglobin: 11.2 g/dL — ABNORMAL LOW (ref 13.0–17.0)
Potassium: 4.1 mEq/L (ref 3.5–5.1)
Sodium: 138 mEq/L (ref 135–145)

## 2010-07-15 LAB — BASIC METABOLIC PANEL
BUN: 4 mg/dL — ABNORMAL LOW (ref 6–23)
Calcium: 7.1 mg/dL — ABNORMAL LOW (ref 8.4–10.5)
Chloride: 100 mEq/L (ref 96–112)
GFR calc Af Amer: >60 mL/min (ref >60)
Glucose, Bld: 102 mg/dL — ABNORMAL HIGH (ref 70–99)
Potassium: 4.4 mEq/L (ref 3.5–5.1)

## 2010-07-15 LAB — HEPATIC FUNCTION PANEL
ALT: 14 U/L (ref 0–53)
Alkaline Phosphatase: 71 U/L (ref 39–117)
Indirect Bilirubin: 0.3 mg/dL (ref 0.3–0.9)
Total Protein: 5 g/dL — ABNORMAL LOW (ref 6.0–8.3)

## 2010-07-15 LAB — CBC
HCT: 26.9 % — ABNORMAL LOW (ref 39.0–52.0)
Hemoglobin: 9 g/dL — ABNORMAL LOW (ref 13.0–17.0)
MCH: 27.7 pg (ref 26.0–34.0)
MCHC: 33.5 g/dL (ref 30.0–36.0)
MCV: 82.8 fL (ref 78.0–100.0)

## 2010-07-15 LAB — PREPARE FRESH FROZEN PLASMA: Unit division: 0

## 2010-07-16 LAB — URINALYSIS, ROUTINE W REFLEX MICROSCOPIC
Bilirubin Urine: NEGATIVE
Glucose, UA: NEGATIVE mg/dL
Ketones, ur: 40 mg/dL — AB
pH: 7.5 (ref 5.0–8.0)

## 2010-07-16 LAB — BASIC METABOLIC PANEL
BUN: 4 mg/dL — ABNORMAL LOW (ref 6–23)
CO2: 33 mEq/L — ABNORMAL HIGH (ref 19–32)
Chloride: 95 mEq/L — ABNORMAL LOW (ref 96–112)
Creatinine, Ser: 0.48 mg/dL — ABNORMAL LOW (ref 0.50–1.35)
GFR calc Af Amer: >60 mL/min (ref >60)
Glucose, Bld: 75 mg/dL (ref 70–99)

## 2010-07-16 LAB — CBC
HCT: 26.5 % — ABNORMAL LOW (ref 39.0–52.0)
Hemoglobin: 8.8 g/dL — ABNORMAL LOW (ref 13.0–17.0)
MCV: 83.9 fL (ref 78.0–100.0)
RDW: 16.5 % — ABNORMAL HIGH (ref 11.5–15.5)
WBC: 9 10*3/uL (ref 4.0–10.5)

## 2010-07-16 LAB — VITAMIN B12: Vitamin B-12: 615 pg/mL (ref 211–911)

## 2010-07-16 LAB — URINE MICROSCOPIC-ADD ON

## 2010-07-17 DIAGNOSIS — S42202B Unspecified fracture of upper end of left humerus, initial encounter for open fracture: Secondary | ICD-10-CM

## 2010-07-17 DIAGNOSIS — S42209A Unspecified fracture of upper end of unspecified humerus, initial encounter for closed fracture: Secondary | ICD-10-CM

## 2010-07-17 HISTORY — PX: FRACTURE SURGERY: SHX138

## 2010-07-17 HISTORY — DX: Unspecified fracture of upper end of left humerus, initial encounter for open fracture: S42.202B

## 2010-07-17 HISTORY — DX: Unspecified fracture of upper end of unspecified humerus, initial encounter for closed fracture: S42.209A

## 2010-07-17 LAB — CBC
Platelets: 188 10*3/uL (ref 150–400)
RBC: 2.92 MIL/uL — ABNORMAL LOW (ref 4.22–5.81)
RDW: 16.6 % — ABNORMAL HIGH (ref 11.5–15.5)
WBC: 6.6 10*3/uL (ref 4.0–10.5)

## 2010-07-17 LAB — CROSSMATCH
ABO/RH(D): B NEG
Unit division: 0
Unit division: 0
Unit division: 0

## 2010-07-17 LAB — PROTEIN C, TOTAL: Protein C, Total: 79 % (ref 72–160)

## 2010-07-17 LAB — URINE CULTURE
Colony Count: NO GROWTH
Culture  Setup Time: 201207012053

## 2010-07-17 LAB — PROTEIN S ACTIVITY: Protein S Activity: 116 % (ref 69–129)

## 2010-07-17 LAB — LUPUS ANTICOAGULANT PANEL
DRVVT: 49.2 secs — ABNORMAL HIGH (ref 36.2–44.3)
Lupus Anticoagulant: DETECTED — AB
dRVVT Incubated 1:1 Mix: 41.2 secs (ref 36.2–44.3)

## 2010-07-17 LAB — PROTEIN C ACTIVITY: Protein C Activity: 99 % (ref 75–133)

## 2010-07-17 NOTE — Op Note (Signed)
NAMETOWNES, FUHS NO.:  [id number]  MEDICAL RECORD NO.:  [id number]  LOCATION:  3306                         FACILITY:  [facility]  PHYSICIAN:  Eulas Post, MD    DATE OF BIRTH:  [birth year]  DATE OF PROCEDURE:  07/13/2010 DATE OF DISCHARGE:                              OPERATIVE REPORT   ATTENDING SURGEONS:  Eulas Post, MD  ASSISTANT SURGEON:  None.  PREOPERATIVE DIAGNOSIS:  Left humerus, periprosthetic fracture dislocation.  POSTOPERATIVE DIAGNOSIS:  Left humerus, periprosthetic fracture dislocation.  OPERATIVE PROCEDURE: 1. Removal of left cemented prosthetic humerus, deep, complex. 2. Open reduction and internal fixation, left humeral shaft fracture     with cerclage wiring. 3. Revision, cemented left hemiarthroplasty for fracture.  PREOPERATIVE INDICATIONS:  [name] is a 50 year old gentleman who has severe alcoholism and has had a past history of a left proximal humerus fracture that initially was treated with ORIF, which subsequently failed and was converted to hemiarthroplasty.  He did well with the hemiarthroplasty for at least a couple of years; however, he recently fell off a bridge while he was out fishing.  He was intoxicated as well as had marijuana in his system, and presented with a dislocated fractured periprosthetic left humerus.  He elected to undergo the above- named procedures.  The risks, benefits, alternatives were discussed before the procedure including but not limited to risks of infection, bleeding, nerve injury, specifically injury to the axillary nerve, the radial nerve as well as all of the neurovascular structures of the arm, dislocation, the need for conversion to reverse shoulder arthroplasty, malunion, nonunion, complete loss of function of the arm, stiffness, cardiopulmonary complications, compartment syndrome among others.  He is willing to proceed.  OPERATIVE IMPLANTS:  I removed and a Biomet  cemented fracture stem with a size 44 head and I replaced this with a long stem cemented fracture stem, with the same sized head as was implanted the first time, and I also applied a Synthes-locking plate with numerous cerclage wires as well as a single unicortical locking screw distally.  I had a number of 1.0-mm wires and also a number of 1.7-mm wires.  OPERATIVE PROCEDURE:  The patient was brought to the operating room, placed in supine position.  The IV antibiotics were given.  General anesthesia was administered.  He was placed in the beach-chair position. Left upper extremity was prepped and draped in the usual sterile fashion.  Incision was made through his previous deltopectoral approach and was extended distally.  I used the brachialis anterior splitting approach distally, and the deltopectoral approach proximally.  First, I identified the stem, which was dislocated and the spun 180 degrees.  I exposed my fracture segments both proximally and distally. I removed the stem along with the cement.  The subscapularis had healed to the greater tuberosity and to the supraspinatus, although there was no actual bone left on either of the tuberosities.  Nonetheless, the soft tissue sleeve was intact.  I did divide this in order to gain access to the proximal humerus.  This was approximately at the level of the rotator interval.  Landmarks were extremely difficult to identify, particularly with  the prosthesis spun 180 degrees, and a free floating long segment of bone that was fractured.  Nonetheless, I did remove the prosthesis as well as the cement, and then exposed the fracture site, and I left the pectoralis tendon intact. This tagged the subscapularis.  I reduced the fracture anatomically and held it provisionally with a clamp.  I then applied the plate along the medial border of the bone and secured it proximally and distally with bicortical nonlocking screws.  This applied the  reduction and got the plate to be directly adherent to the bone.  I then placed numerous cerclage wires around the bone both medially and laterally, and tensioned them to anywhere between 30 and 45 kg of force.  The proximal aspect of the bone got much weaker.  I used less force.  Once I had satisfactory fixation with a cerclage wires, I removed the clamp and then used a C-arm to confirm all of the position of the wires as well as the reduction.  During passage of the wires both proximally and distally, I took care to be directly adherent to the bone to minimize risk of injury to the radial nerve as well as the medial neurovascular structures.  Once I had recreated the tube of the humerus, I then turned my attention to the tuberosities and the supraspinatus and subscapularis.  I passed a total of four #2 FiberWire through the supraspinatus, and I also passed the sutures through the shaft.  There was substantial loss of bone proximally such that the shaft was very distal to where the native head needed to fit.  I did tag the subscapularis and then sequentially reamed with a hand reamer.  The canal restrictor was still in my way, and so I drilled through this with a drill.  I then sequentially broached up to a size 8, and this was fairly tight and so I selected a size 6 stem.  I placed a trial prosthesis into the proximal humerus, and then reduced this and held this into place in an elevated position as I tried to get an assessment of the height that I needed.  This was extremely difficult.  I did use C-arm to help guide my landmarks to recreate the "optic arch" of the scapular relationship to the humeral neck.  Once I was satisfied with this, I placed the canal restrictor, and I was then able to perch my real prosthesis on the canal restrictor and get the height that I wanted.  I then mixed three bags of cement, the DePuy one cement, and then placed this with a cement gun.  I did  pressurize this as much as possible, and took care to try and minimize extravasation of the cement within the fracture site as well as out of the other holes.  Once the cement had cured and the prosthesis was in the appropriate position, I trialed with a humeral head.  I did cement the prosthesis in approximately 30 degrees of retroversion, and it had to be remarkably tall from the level of the bone given the degree of bone that I had to reconstruct.  I may have been just a touch shorter than I had plan; however, the gothic arch had been recreated and the soft tissue tension was reasonably good.  After trialing, I found good stability with the shoulder, and the prosthesis was stable anteriorly, and aimed directly back at the glenoid.  Therefore, the above-named components were selected, and I impacted the head into place.  Then, I did place a unicortical locking screw at the distal most portion of the plate, and portions of the plate that were left open were primarily due to the insertion of the pectoralis tendon, which was just below the top cerclage wire. Additionally, I left open hole that I used the nonlocking screw to set the plate down.  Excellent bony reconstruction was achieved and appropriate restoration of height, version, and soft tissue tension was achieved.  I then repaired the subscapularis tendon to the supraspinatus tendon, wrapped around the proximal humerus on the stem with #2 FiberWire, using all four #2 FiberWire wrapping around the stem itself.  I also used one of the FiberWire that I passed through the bone to go up and around in a round-the-world type stitch, however, the gap was so dramatic from the bone to the tendons above that I only used one stitch, and I am not sure that this is going to function for anything real. There was no bone within the tuberosities to work with and so basically I have recreated a soft tissue sleeve around the stem, which  hopefully will provide some degree of anterior stability.  I had considered doing a reverse arthroplasty; however, given his behavior and rate of complications, I did not want to increase his potential complication rate with this surgery.  If the shoulder proved to be unstable, then we may give consideration to converting to a reverse arthroplasty; however, given his lack of compliance and challenging life decisions, a Girdlestone may also be in his future if this operation is met with any significant complications.  Nonetheless, I did achieve satisfactory soft tissue sleeve directly around the head of the prosthesis to try and restore some degree of stability, and I irrigated the wounds copiously with pulse lavage.  He did receive at least 4 units of packed red blood cells, and I would direct the reader to the anesthesia records to determine if he received any additional. He received 4 units packed red blood cells and 2 units of fresh frozen plasma during the course of his operation, and he has a very strong tendency towards bleeding, probably secondary to dysfunctional platelets from his chronic alcoholism.  After irrigating all the wounds copiously, I closed the deep tissue with #1 Vicryl, and then closed the skin with nylon and staples.  The skin itself was able to be close, although he had a fair amount of swelling, but nonetheless I did feel that closure was in his best interest at the current time, rather than placing a wound VAC, and the arm was actually not significantly more swollen after the operation than it was beforehand.  We will plan to monitor him closely for neurovascular compromise as well as compartment syndrome, and he will be in a sling and will not be using that arm for much of anything for a fairly extended period of time.  He tolerated the procedure well, and there were no complications, and this was extremely challenging case.     Eulas Post,  MD     JPL/MEDQ  D:  07/14/2010  T:  07/14/2010  Job:  [id number]  Electronically Signed by Teryl Lucy MD on 07/17/2010 07:15:56 PM

## 2010-07-18 LAB — CARDIOLIPIN ANTIBODIES, IGG, IGM, IGA
Anticardiolipin IgA: 9 APL U/mL — ABNORMAL LOW (ref <22)
Anticardiolipin IgG: 4 GPL U/mL — ABNORMAL LOW (ref <23)
Anticardiolipin IgM: 7 MPL U/mL — ABNORMAL LOW (ref <11)

## 2010-07-18 LAB — BETA-2-GLYCOPROTEIN I ABS, IGG/M/A: Beta-2-Glycoprotein I IgM: 35 M Units — ABNORMAL HIGH (ref <20)

## 2010-07-19 NOTE — Discharge Summary (Signed)
  NAMETAVIN, VERNET NO.:  [id number]  MEDICAL RECORD NO.:  [id number]  LOCATION:  5015                         FACILITY:  [facility]  PHYSICIAN:  Eulas Post, MD    DATE OF BIRTH:  04-Oct-1960  DATE OF ADMISSION:  07/12/2010 DATE OF DISCHARGE:  07/17/2010                              DISCHARGE SUMMARY   ADMISSION DIAGNOSES: 1. Left proximal humerus fracture dislocation, periprosthetic. 2. Alcohol abuse.  DISCHARGE DIAGNOSES: 1. Left proximal humerus fracture dislocation, periprosthetic. 2. Alcohol abuse. 3. Acute postoperative blood loss anemia.  HOSPITAL COURSE:  [name] is a 50 year old who has had a previous left shoulder ORIF followed by hemiarthroplasty who 2 years later had a fracture dislocation of his prosthesis.  He underwent ORIF of the shaft with periprosthetic revision to long stem arthroplasty.  He tolerated this well.  There were no complications.  Postoperatively, his wounds had serous drainage, that was treated with dressing changes.  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  I actually had to give him a blood transfusion at his open reduction and internal fixation, which [facility].  He was neurovascularly intact throughout his arm after the cerclage wiring and long stem prosthesis placement.  He was placed on a CIWA protocol, and did have some mild evidence of withdrawal, with tachycardia, as well as some confusion and delirium.  He was given perioperative antibiotics for antimicrobial prophylaxis.  He is planned to be discharged home with a followup with me in 2 weeks.  There were no complications and he benefited maximally from this hospital stay.  His decision making for himself has certainly been challenging, and he puts himself at high risk for complications.  Hopefully, he will be able to heal this most recent complicated surgery, and be able to  regain some level of function.     Eulas Post, MD     JPL/MEDQ  D:  07/17/2010  T:  07/17/2010  Job:  [id number]  Electronically Signed by Teryl Lucy MD on 07/19/2010 11:37:14 AM

## 2010-07-21 LAB — PROTHROMBIN GENE MUTATION

## 2010-07-25 ENCOUNTER — Emergency Department (HOSPITAL_COMMUNITY): Payer: Medicaid Other

## 2010-07-25 ENCOUNTER — Inpatient Hospital Stay (HOSPITAL_COMMUNITY)
Admission: EM | Admit: 2010-07-25 | Discharge: 2010-08-09 | DRG: 495 | Disposition: A | Discharge status: Skilled Nursing Facility | Payer: Medicaid Other | Attending: Internal Medicine | Admitting: Internal Medicine

## 2010-07-25 DIAGNOSIS — F1020 Alcohol dependence, uncomplicated: Secondary | ICD-10-CM | POA: Diagnosis present

## 2010-07-25 DIAGNOSIS — Z681 Body mass index (BMI) 19 or less, adult: Secondary | ICD-10-CM

## 2010-07-25 DIAGNOSIS — F411 Generalized anxiety disorder: Secondary | ICD-10-CM | POA: Diagnosis present

## 2010-07-25 DIAGNOSIS — E46 Unspecified protein-calorie malnutrition: Secondary | ICD-10-CM | POA: Diagnosis present

## 2010-07-25 DIAGNOSIS — A4101 Sepsis due to Methicillin susceptible Staphylococcus aureus: Secondary | ICD-10-CM | POA: Diagnosis not present

## 2010-07-25 DIAGNOSIS — Y831 Surgical operation with implant of artificial internal device as the cause of abnormal reaction of the patient, or of later complication, without mention of misadventure at the time of the procedure: Secondary | ICD-10-CM | POA: Diagnosis present

## 2010-07-25 DIAGNOSIS — F1910 Other psychoactive substance abuse, uncomplicated: Secondary | ICD-10-CM | POA: Diagnosis present

## 2010-07-25 DIAGNOSIS — D649 Anemia, unspecified: Secondary | ICD-10-CM | POA: Diagnosis present

## 2010-07-25 DIAGNOSIS — Z96619 Presence of unspecified artificial shoulder joint: Secondary | ICD-10-CM

## 2010-07-25 DIAGNOSIS — T4275XA Adverse effect of unspecified antiepileptic and sedative-hypnotic drugs, initial encounter: Secondary | ICD-10-CM | POA: Diagnosis not present

## 2010-07-25 DIAGNOSIS — Z79899 Other long term (current) drug therapy: Secondary | ICD-10-CM

## 2010-07-25 DIAGNOSIS — IMO0002 Reserved for concepts with insufficient information to code with codable children: Secondary | ICD-10-CM | POA: Diagnosis present

## 2010-07-25 DIAGNOSIS — K209 Esophagitis, unspecified without bleeding: Secondary | ICD-10-CM | POA: Diagnosis present

## 2010-07-25 DIAGNOSIS — K560 Paralytic ileus: Secondary | ICD-10-CM | POA: Diagnosis not present

## 2010-07-25 DIAGNOSIS — T8450XA Infection and inflammatory reaction due to unspecified internal joint prosthesis, initial encounter: Principal | ICD-10-CM | POA: Diagnosis present

## 2010-07-25 DIAGNOSIS — D72829 Elevated white blood cell count, unspecified: Secondary | ICD-10-CM | POA: Diagnosis not present

## 2010-07-25 DIAGNOSIS — F17200 Nicotine dependence, unspecified, uncomplicated: Secondary | ICD-10-CM | POA: Diagnosis present

## 2010-07-25 DIAGNOSIS — E876 Hypokalemia: Secondary | ICD-10-CM | POA: Diagnosis present

## 2010-07-25 DIAGNOSIS — Z9181 History of falling: Secondary | ICD-10-CM

## 2010-07-25 DIAGNOSIS — R6510 Systemic inflammatory response syndrome (SIRS) of non-infectious origin without acute organ dysfunction: Secondary | ICD-10-CM | POA: Diagnosis present

## 2010-07-25 DIAGNOSIS — Z23 Encounter for immunization: Secondary | ICD-10-CM

## 2010-07-25 DIAGNOSIS — IMO0001 Reserved for inherently not codable concepts without codable children: Secondary | ICD-10-CM | POA: Diagnosis present

## 2010-07-25 DIAGNOSIS — F3289 Other specified depressive episodes: Secondary | ICD-10-CM | POA: Diagnosis present

## 2010-07-25 DIAGNOSIS — E871 Hypo-osmolality and hyponatremia: Secondary | ICD-10-CM | POA: Diagnosis not present

## 2010-07-25 DIAGNOSIS — F329 Major depressive disorder, single episode, unspecified: Secondary | ICD-10-CM | POA: Diagnosis present

## 2010-07-25 DIAGNOSIS — A419 Sepsis, unspecified organism: Secondary | ICD-10-CM | POA: Diagnosis not present

## 2010-07-25 LAB — DIFFERENTIAL
Basophils Relative: 1 % (ref 0–1)
Eosinophils Relative: 4 % (ref 0–5)
Lymphocytes Relative: 15 % (ref 12–46)
Lymphs Abs: 1.3 10*3/uL (ref 0.7–4.0)
Monocytes Relative: 9 % (ref 3–12)
Neutro Abs: 6.2 10*3/uL (ref 1.7–7.7)

## 2010-07-25 LAB — FERRITIN: Ferritin: 217 ng/mL (ref 22–322)

## 2010-07-25 LAB — CBC
HCT: 29.2 % — ABNORMAL LOW (ref 39.0–52.0)
Hemoglobin: 9.6 g/dL — ABNORMAL LOW (ref 13.0–17.0)
MCH: 28.5 pg (ref 26.0–34.0)
MCV: 86.6 fL (ref 78.0–100.0)
RBC: 3.37 MIL/uL — ABNORMAL LOW (ref 4.22–5.81)
WBC: 8.8 10*3/uL (ref 4.0–10.5)

## 2010-07-25 LAB — IRON AND TIBC
Iron: 29 ug/dL — ABNORMAL LOW (ref 42–135)
TIBC: 201 ug/dL — ABNORMAL LOW (ref 215–435)
UIBC: 172 ug/dL

## 2010-07-25 LAB — FOLATE: Folate: 7.9 ng/mL

## 2010-07-25 LAB — ETHANOL: Alcohol, Ethyl (B): 93 mg/dL — ABNORMAL HIGH (ref 0–11)

## 2010-07-25 LAB — POCT I-STAT, CHEM 8
Calcium, Ion: 1 mmol/L — ABNORMAL LOW (ref 1.12–1.32)
Chloride: 105 mEq/L (ref 96–112)
Creatinine, Ser: 1.1 mg/dL (ref 0.50–1.35)
Glucose, Bld: 64 mg/dL — ABNORMAL LOW (ref 70–99)
HCT: 32 % — ABNORMAL LOW (ref 39.0–52.0)

## 2010-07-26 ENCOUNTER — Observation Stay (HOSPITAL_COMMUNITY): Payer: Medicaid Other

## 2010-07-26 DIAGNOSIS — F4320 Adjustment disorder, unspecified: Secondary | ICD-10-CM

## 2010-07-26 LAB — BASIC METABOLIC PANEL
Calcium: 7.1 mg/dL — ABNORMAL LOW (ref 8.4–10.5)
GFR calc Af Amer: >60 mL/min (ref >60)
GFR calc non Af Amer: >60 mL/min (ref >60)
Sodium: 136 mEq/L (ref 135–145)

## 2010-07-26 LAB — CBC
MCH: 28.9 pg (ref 26.0–34.0)
MCHC: 32.6 g/dL (ref 30.0–36.0)
Platelets: 492 10*3/uL — ABNORMAL HIGH (ref 150–400)

## 2010-07-26 LAB — MAGNESIUM: Magnesium: 1.8 mg/dL (ref 1.5–2.5)

## 2010-07-26 NOTE — Consult Note (Unsigned)
NAMEPRISCILLA, FINKLEA NO.:  [id number]  MEDICAL RECORD NO.:  [id number]  LOCATION:  4709                         FACILITY:  [facility]  PHYSICIAN:  Eulogio Ditch, MD DATE OF BIRTH:  1960/02/03  DATE OF CONSULTATION:  07/26/2010 DATE OF DISCHARGE:                                CONSULTATION   REASON FOR CONSULTATION:  History of alcohol abuse.  HISTORY OF PRESENT ILLNESS:  A 50 year old male who was admitted on the medical floor after he fell at home.  The patient also [place] recently and got his left proximal humerus dislocated.  The patient got open reduction and internal fixation for that.  The patient is currently requesting pain medications for that.  The patient has a history of polysubstance abuse.  The patient reported that he is not on Celexa and Neurontin and when he is not on these medications, he starts drinking and when he start drinking beers, he cannot stop.  The patient reported he follows at [facility] and it is almost 1 month that he has not followed at [facility] and he is not on his medications.  PAST MEDICAL HISTORY: 1. History of GI bleed. 2. Recent left proximal humerus fracture dislocation which was     repaired on July 17, 2010.  SOCIAL HISTORY:  The patient is married, lives with the wife.  He is not working.  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  He generally stays at home and reports that he likes cleaning and cooking.  Substance abuse history:  The patient has a history of abusing marijuana and alcohol but denies abusing heroin, cocaine.  The patient has got rehab in the past at [facility].  ALLERGIES:  No known drug allergies.  PHYSICAL EXAMINATION:  CARDIOVASCULAR:  S1, S2 clear.  Regular rate and rhythm. CHEST:  Clear bilaterally.  Labs within normal limits except for potassium 2.4.  CT scan of the head without contrast done on July 10 without any acute intracranial bleed  or abnormality.  MENTAL STATUS EXAM:  The patient is calm, cooperative during the interview.  Fair eye contact.  No psychomotor agitation, retardation noted during the interview.  Speech normal in rate, rhythm, and volume. Mood:  Euthymic/anxious.  Affect:  Mood congruent.  Thought process: Logical and goal directed.  Thought content:  Not suicidal or homicidal, not delusional.  Thought perception:  No audiovisual hallucination reported, not internally preoccupied.  Cognition:  Alert, awake, oriented x3.  Memory immediate, recent remote fair.  Attention and concentration fair.  Abstraction ability fair, insight and judgment fair.  DIAGNOSES:  AXIS I:  Chronic alcohol dependence/chronic marijuana dependence, depressive disorder not otherwise specified, rule out major depressive disorder, recurrent type. AXIS II:  Deferred. AXIS III:  See medical notes. AXIS IV:  Chronic alcohol abuse, psychosocial stressors. AXIS V:  50.  RECOMMENDATIONS: 1. The patient can be started back on Celexa 40 mg p.o. daily and     Neurontin 100 mg 3 times a day. 2. The patient also requested to be started on Protonix. 3. The patient wanted to follow up at [facility].  I recommended the  patient to go to inpatient rehab like [facility] for 2-3 weeks.  The     patient will work with clinical social worker to go over this     option.  If he do not want to go to inpatient rehab, the patient     can be referred to follow up at [facility]. 4. The patient detox protocol can be continued.  The patient is on     Ativan detox protocol.  Thanks for involving me in taking care of this patient.     Eulogio Ditch, MD     SA/MEDQ  D:  07/26/2010  T:  07/26/2010  Job:  [id number]

## 2010-07-27 ENCOUNTER — Inpatient Hospital Stay (HOSPITAL_COMMUNITY): Payer: Medicaid Other

## 2010-07-27 ENCOUNTER — Encounter (HOSPITAL_COMMUNITY): Payer: Self-pay | Admitting: Radiology

## 2010-07-27 DIAGNOSIS — M79609 Pain in unspecified limb: Secondary | ICD-10-CM

## 2010-07-27 LAB — SEDIMENTATION RATE: Sed Rate: 44 mm/hr — ABNORMAL HIGH (ref 0–16)

## 2010-07-27 LAB — CBC
MCH: 28.4 pg (ref 26.0–34.0)
MCHC: 32.1 g/dL (ref 30.0–36.0)
Platelets: 501 10*3/uL — ABNORMAL HIGH (ref 150–400)
Platelets: 488 10*3/uL — ABNORMAL HIGH (ref 150–400)
RBC: 3.13 MIL/uL — ABNORMAL LOW (ref 4.22–5.81)
RDW: 21.6 % — ABNORMAL HIGH (ref 11.5–15.5)
WBC: 20.4 10*3/uL — ABNORMAL HIGH (ref 4.0–10.5)

## 2010-07-27 LAB — DIFFERENTIAL
Eosinophils Relative: 0 % (ref 0–5)
Monocytes Absolute: 1.8 10*3/uL — ABNORMAL HIGH (ref 0.1–1.0)
Monocytes Relative: 8 % (ref 3–12)
Neutrophils Relative %: 89 % — ABNORMAL HIGH (ref 43–77)

## 2010-07-27 LAB — BASIC METABOLIC PANEL
Chloride: 97 mEq/L (ref 96–112)
Creatinine, Ser: 0.67 mg/dL (ref 0.50–1.35)
GFR calc Af Amer: >60 mL/min (ref >60)
Potassium: 4.1 mEq/L (ref 3.5–5.1)
Sodium: 131 mEq/L — ABNORMAL LOW (ref 135–145)

## 2010-07-27 MED ORDER — IOHEXOL 300 MG/ML  SOLN
100.0000 mL | Freq: Once | INTRAMUSCULAR | Status: AC | PRN
  Administered 2010-07-27: 100 mL via INTRAVENOUS

## 2010-07-28 LAB — BASIC METABOLIC PANEL
Calcium: 7.9 mg/dL — ABNORMAL LOW (ref 8.4–10.5)
GFR calc non Af Amer: >60 mL/min (ref >60)
Sodium: 134 mEq/L — ABNORMAL LOW (ref 135–145)

## 2010-07-28 LAB — DIFFERENTIAL
Basophils Absolute: 0 10*3/uL (ref 0.0–0.1)
Eosinophils Absolute: 0.4 10*3/uL (ref 0.0–0.7)
Lymphocytes Relative: 6 % — ABNORMAL LOW (ref 12–46)
Lymphs Abs: 1.1 10*3/uL (ref 0.7–4.0)
Monocytes Absolute: 1.4 10*3/uL — ABNORMAL HIGH (ref 0.1–1.0)
Neutro Abs: 14.9 10*3/uL — ABNORMAL HIGH (ref 1.7–7.7)

## 2010-07-28 LAB — CBC
MCH: 28.5 pg (ref 26.0–34.0)
MCHC: 32.3 g/dL (ref 30.0–36.0)
Platelets: 443 10*3/uL — ABNORMAL HIGH (ref 150–400)
RDW: 21.2 % — ABNORMAL HIGH (ref 11.5–15.5)

## 2010-07-28 LAB — POCT I-STAT, CHEM 8
Calcium, Ion: 1.11 mmol/L — ABNORMAL LOW (ref 1.12–1.32)
Chloride: 102 mEq/L (ref 96–112)
HCT: 15 % — ABNORMAL LOW (ref 39.0–52.0)
Hemoglobin: 5.1 g/dL — CL (ref 13.0–17.0)
TCO2: 23 mmol/L (ref 0–100)

## 2010-07-28 LAB — SURGICAL PCR SCREEN: Staphylococcus aureus: POSITIVE — AB

## 2010-07-28 LAB — GRAM STAIN

## 2010-07-28 LAB — GLUCOSE, CAPILLARY: Glucose-Capillary: 132 mg/dL — ABNORMAL HIGH (ref 70–99)

## 2010-07-29 DIAGNOSIS — Z96619 Presence of unspecified artificial shoulder joint: Secondary | ICD-10-CM

## 2010-07-29 DIAGNOSIS — A4901 Methicillin susceptible Staphylococcus aureus infection, unspecified site: Secondary | ICD-10-CM

## 2010-07-29 DIAGNOSIS — T8450XA Infection and inflammatory reaction due to unspecified internal joint prosthesis, initial encounter: Secondary | ICD-10-CM

## 2010-07-29 DIAGNOSIS — R7881 Bacteremia: Secondary | ICD-10-CM

## 2010-07-29 LAB — CROSSMATCH
ABO/RH(D): B NEG
Unit division: 0
Unit division: 0

## 2010-07-29 LAB — CBC
MCH: 29.3 pg (ref 26.0–34.0)
MCHC: 35 g/dL (ref 30.0–36.0)
Platelets: 284 10*3/uL (ref 150–400)
RDW: 17.8 % — ABNORMAL HIGH (ref 11.5–15.5)

## 2010-07-29 LAB — BASIC METABOLIC PANEL
Calcium: 7 mg/dL — ABNORMAL LOW (ref 8.4–10.5)
GFR calc non Af Amer: >60 mL/min (ref >60)
Sodium: 133 mEq/L — ABNORMAL LOW (ref 135–145)

## 2010-07-29 LAB — MAGNESIUM: Magnesium: 1.9 mg/dL (ref 1.5–2.5)

## 2010-07-30 LAB — COMPREHENSIVE METABOLIC PANEL
ALT: 6 U/L (ref 0–53)
AST: 10 U/L (ref 0–37)
Albumin: 1.5 g/dL — ABNORMAL LOW (ref 3.5–5.2)
Alkaline Phosphatase: 63 U/L (ref 39–117)
CO2: 27 mEq/L (ref 19–32)
Chloride: 106 mEq/L (ref 96–112)
GFR calc non Af Amer: >60 mL/min (ref >60)
Potassium: 4.1 mEq/L (ref 3.5–5.1)
Sodium: 137 mEq/L (ref 135–145)
Total Bilirubin: 1 mg/dL (ref 0.3–1.2)

## 2010-07-30 LAB — CBC
MCV: 84.9 fL (ref 78.0–100.0)
Platelets: 380 10*3/uL (ref 150–400)
RBC: 2.98 MIL/uL — ABNORMAL LOW (ref 4.22–5.81)
RDW: 17.9 % — ABNORMAL HIGH (ref 11.5–15.5)
WBC: 7.1 10*3/uL (ref 4.0–10.5)

## 2010-07-31 LAB — BASIC METABOLIC PANEL
CO2: 28 mEq/L (ref 19–32)
Calcium: 7.6 mg/dL — ABNORMAL LOW (ref 8.4–10.5)
Creatinine, Ser: 0.63 mg/dL (ref 0.50–1.35)
GFR calc non Af Amer: >60 mL/min (ref >60)
Sodium: 137 mEq/L (ref 135–145)

## 2010-07-31 LAB — CBC
MCH: 28.7 pg (ref 26.0–34.0)
MCHC: 33.3 g/dL (ref 30.0–36.0)
MCV: 86 fL (ref 78.0–100.0)
Platelets: 425 10*3/uL — ABNORMAL HIGH (ref 150–400)
RBC: 3 MIL/uL — ABNORMAL LOW (ref 4.22–5.81)

## 2010-07-31 LAB — TISSUE CULTURE

## 2010-07-31 LAB — WOUND CULTURE

## 2010-07-31 LAB — HEMOGLOBIN AND HEMATOCRIT, BLOOD
HCT: 24.6 % — ABNORMAL LOW (ref 39.0–52.0)
Hemoglobin: 8.2 g/dL — ABNORMAL LOW (ref 13.0–17.0)

## 2010-07-31 LAB — GRAM STAIN

## 2010-07-31 NOTE — Op Note (Signed)
NAMEGRIFFIN, [name] NO.:  [id number]  MEDICAL RECORD NO.:  [id number]  LOCATION:  2112                         FACILITY:  [facility]  PHYSICIAN:  Eulas Post, MD    DATE OF BIRTH:  1960-04-03  DATE OF PROCEDURE:  07/28/2010 DATE OF DISCHARGE:                              OPERATIVE REPORT   ATTENDING SURGEON:  Eulas Post, MD  FIRST ASSISTANT:  Janace Litten, orthopedic PA-C  PREOPERATIVE DIAGNOSIS:  Infected left shoulder arthroplasty.  POSTOPERATIVE DIAGNOSIS:  Infected left shoulder arthroplasty.  OPERATIVE PROCEDURE:  I and D left infected prosthetic shoulder arthroplasty open reduction and internal fixation.  ANESTHESIA:  General.  ESTIMATED BLOOD LOSS:  250 mL.  OPERATIVE PROCEDURE:  Irrigation and debridement, left shoulder, hemiarthroplasty with debridement of skin, subcutaneous tissue, muscle, bone, removal of foreign suture material, and placement of wound VAC.  PREOPERATIVE INDICATIONS:  Mr. Carlyn Mullenbach is a 50 year old gentleman who is a severe alcoholic who had fallen off a bridge about 2 weeks ago and had a fracture dislocation of his left prosthetic humerus.  He underwent revision long-stem arthroplasty with cabling and cerclage. Postoperatively, he returned from the hospital with severe intoxication, severe pain in the left arm after having fallen again, a large hematoma, and subsequent diagnosis of infected left arthroplasty by CT scan.  He elected to undergo the above-named procedures on an emergent basis.  The risks, benefits, and alternatives were discussed with him before the procedure including but not limited to risks of recurrent infection, the need for further surgical intervention, the need for possible amputation, the need for removal of the prosthesis, cardiopulmonary complications among others, and he is willing to proceed.  OPERATIVE PROCEDURE:  The patient was brought to the operating room and placed in supine  position.  General anesthesia was administered.  He has already been on IV vancomycin and Zosyn protocol.  He was in the supine position and the left upper extremity was prepped and draped in the usual sterile fashion.  Incision was made through the previous incision. All the staples and sutures were removed.  The deep tissue was exposed and deep cultures were taken, both tissue as well as swabs.  These were sent for culture and requested for 2-week culture growth in order to evaluate for the possibility of Propionibacterium acnes.  Once the cultures have been taken, I exposed the proximal hardware.  He had pus tracking up into the glenoid region.  The humeral head was involved, and there was infection deeper around the prosthetic stem and tracking down the arm.  There was necrotic tissue as well.  All of the necrotic tissue and infected tissue was excised.  I irrigated a total of 9 L through the wound.  The stem was well fixed and was not easily removed and was therefore left in place.  He may ultimately require removal of his stem; however, the severity of this infection as well as his inability to cooperate with any type of instructions, and the poor healing capacity increases his likelihood for needing either a Girdlestone or potentially even a through shoulder amputation.  This is an extremely difficult problem because the shaft of his  humerus is currently being held together by cables and plate and is filled with cement that is holding the long stem humeral prosthesis and in the setting of infection, there are not a lot of good options.  Nonetheless, we will try and salvage his upper extremity if possible, and I will plan for serial debridements with hopefully suppressive if not curative IV antibiotics, and he will need to have another one or possibly even two surgeries at least in order to get this infection cleared.  I did place a wound VAC and had adequate seal after the  completion of the case.  I did remove the FiberWire that was suturing the rotator cuff back in place after the arthroplasty.  His rotator cuff tissue was essentially nonfunctional. He tolerated the procedure well, and there were no complications.  This is a disaster and we are not even close to resolution of his upper extremity issues.     Eulas Post, MD     JPL/MEDQ  D:  07/28/2010  T:  07/29/2010  Job:  [id number]  Electronically Signed by Teryl Lucy MD on 07/31/2010 10:54:58 AM

## 2010-08-01 ENCOUNTER — Inpatient Hospital Stay (HOSPITAL_COMMUNITY): Payer: Medicaid Other

## 2010-08-01 LAB — CBC
HCT: 24.7 % — ABNORMAL LOW (ref 39.0–52.0)
Hemoglobin: 8.2 g/dL — ABNORMAL LOW (ref 13.0–17.0)
MCH: 28.8 pg (ref 26.0–34.0)
MCHC: 33.2 g/dL (ref 30.0–36.0)
RDW: 17.2 % — ABNORMAL HIGH (ref 11.5–15.5)

## 2010-08-01 LAB — BASIC METABOLIC PANEL
BUN: <3 mg/dL — ABNORMAL LOW (ref 6–23)
Calcium: 7.3 mg/dL — ABNORMAL LOW (ref 8.4–10.5)
GFR calc Af Amer: >60 mL/min (ref >60)
GFR calc non Af Amer: >60 mL/min (ref >60)
Glucose, Bld: 123 mg/dL — ABNORMAL HIGH (ref 70–99)
Sodium: 133 mEq/L — ABNORMAL LOW (ref 135–145)

## 2010-08-02 LAB — BASIC METABOLIC PANEL
BUN: 3 mg/dL — ABNORMAL LOW (ref 6–23)
CO2: 32 mEq/L (ref 19–32)
Chloride: 97 mEq/L (ref 96–112)
Creatinine, Ser: 0.69 mg/dL (ref 0.50–1.35)
GFR calc Af Amer: >60 mL/min (ref >60)
Glucose, Bld: 117 mg/dL — ABNORMAL HIGH (ref 70–99)
Potassium: 3.4 mEq/L — ABNORMAL LOW (ref 3.5–5.1)
Sodium: 135 mEq/L (ref 135–145)

## 2010-08-02 LAB — CULTURE, BLOOD (ROUTINE X 2)
Culture  Setup Time: 201207121758
Culture: NO GROWTH

## 2010-08-02 LAB — CBC
MCH: 29.1 pg (ref 26.0–34.0)
MCHC: 33.1 g/dL (ref 30.0–36.0)
Platelets: 437 10*3/uL — ABNORMAL HIGH (ref 150–400)
RBC: 3.06 MIL/uL — ABNORMAL LOW (ref 4.22–5.81)

## 2010-08-02 LAB — ANAEROBIC CULTURE

## 2010-08-02 NOTE — H&P (Signed)
NAMEEVERARD, INTERRANTE NO.:  [id number]  MEDICAL RECORD NO.:  [id number]  LOCATION:  [facility]                         FACILITY:  [facility]  PHYSICIAN:  Thad Ranger, MD       DATE OF BIRTH:  Jun 15, 1960  DATE OF ADMISSION:  07/25/2010 DATE OF DISCHARGE:                             HISTORY & PHYSICAL   PRIMARY CARE PHYSICIAN:  None.  ORTHOPEDIC SURGEON:  Eulas Post, MD  CHIEF COMPLAINT:  Left shoulder pain with a fall, alcohol intoxication.  HISTORY OF PRESENT ILLNESS:  Mr. Ramsburg is a 50 year old male with a history of heavy alcohol abuse, previous GI bleed, nicotine abuse, polysubstance abuse who was brought in by the EMS for falling at home. The patient still appears to be intoxicated and states that he has been falling a lot.  He fell a week ago.  He stated that [place], likely because of his alcohol intoxication and abuse.  At that time, the patient had left proximal humerus dislocation.  The patient underwent open reduction and internal fixation of the shaft with periprosthetic revision.  The patient is unable to give me a good history.  He is requesting pain medications for his left arm, and he states it hurts 10/10, hence he keeps drinking.  ER physician spoke with the patient's mother, who he lives with, she stated that she did not witness the patient's fall.  He was sitting down, then he had gotten up to go to his room and she heard him fall.  The patient was lying on the tiled floor and his shoulder was bleeding, so she called EMS.  The patient otherwise denies any specific complaints except falling and his left shoulder pain.  PAST MEDICAL HISTORY: 1. History of heavy alcohol abuse. 2. History of prior GI bleed. 3. Recent left proximal humerus fracture-dislocation which was     repaired on July 17, 2010. 4. Multiple admissions in the past to behavioral health for alcohol     detox.  SOCIAL HISTORY:  The patient is a heavy drinker.  He  states that he drank three 24 ounces beer last night.  He was not able to quantify his overall drinking habit.  He states that he smokes two packs per day, and he uses marijuana.  He lives with his mother.  ALLERGIES:  No known drug clear.  MEDICATIONS:  He does not take any medications.  PHYSICAL EXAMINATION:  VITAL SIGNS:  Blood pressure 101/60, pulse rate 80, respirations 14, temperature 97.3. GENERAL:  The patient is alert and awake, not in any acute distress, however, disheveled and appears older than his age, poor historian. HEENT:  Normocephalic, atraumatic.  Anicteric sclerae.  Pink conjunctivae.  Pupils reactive to light and accommodation.  EOMI. CARDIOVASCULAR:  S1 and S2 clear.  Regular rate and rhythm. CHEST:  Clear to auscultation bilaterally. ABDOMEN:  Soft, nontender, nondistended.  Normal bowel sounds. EXTREMITIES:  Currently, wound on the left upper extremity, dressing intact.  Per the ER MD, left staples and sutures are intact.  Initially, the wound was bleeding but controlled.  No cyanosis, clubbing, or edema noted in the bilateral lower extremities.  LAB  DIAGNOSTIC DATA:  Sodium 141, potassium 2.4, BUN 3, creatinine 1.1. CBC:  White count 8.8, hemoglobin 9.6, hematocrit 29.2, platelets 510. Potassium 2.4, repeat at 8:49 a.m.  Magnesium 2.1.  RADIOLOGICAL DATA:  Left shoulder x-ray, July 25, 2010, no acute fracture, left shoulder replacement. CT head without contrast, July 25, 2010, no acute intracranial or calvarial findings. CT C-spine, no evidence of any acute cervical spine fracture, traumatic subluxation, or static signs of instability.  Moderate spondylosis.  IMPRESSION AND PLAN:  Mr. Limones is a 50 year old male with history of heavy alcohol abuse, polysubstance abuse, nicotine abuse, falls likely secondary from alcohol use, and recent left shoulder dislocation with repair presents with fall again at home. 1. Alcohol intoxication with abuse.  The patient  appears to be     extremely disheveled with acute intoxication.  We will obtain a     stat alcohol level.  The patient will be placed on CIWA scale with     Ativan protocol.  I will also place him on IV fluids and thiamine,     folic acid, and multivitamins.  The patient will likely benefit     from alcohol detox as well as Psychiatry consultation.  I have     consulted Psychiatry for further recommendation.  The patient has     prior history of admissions to behavioral health center. 2. Falls, likely from the alcohol intoxication and abuse.  PT, OT     evaluation will be obtained when the patient is sober. 3. Hypokalemia.  Stat magnesium level was within normal range.  The     patient has been placed on the p.o. and IV potassium replacement.     We will recheck another potassium level today at 4:00 p.m. 4. History of polysubstance abuse and nicotine abuse.  The patient was     strongly counseled.  We will obtain substance abuse counseling.  I     will place him on NicoDerm patch. 5. Left shoulder and arm pain.  He will be placed on pain medications.     Repeat shoulder x-ray was obtained today which showed no acute     fracture at this time.  The wound is currently not bleeding.  If     any issues, we will consult with Dr. Teryl Lucy for     recommendations. 6. Deep venous thrombosis prophylaxis.  Lovenox for deep venous     thrombosis prophylaxis.     Thad Ranger, MD     RR/MEDQ  D:  07/25/2010  T:  07/25/2010  Job:  [id number]  cc:   Eulas Post, MD  Electronically Signed by Andres Labrum RAI  on 08/02/2010 06:19:04 PM

## 2010-08-03 ENCOUNTER — Inpatient Hospital Stay (HOSPITAL_COMMUNITY): Payer: Medicaid Other

## 2010-08-03 LAB — CBC
HCT: 24.2 % — ABNORMAL LOW (ref 39.0–52.0)
MCH: 29.3 pg (ref 26.0–34.0)
MCHC: 33.1 g/dL (ref 30.0–36.0)
MCV: 88.6 fL (ref 78.0–100.0)
RDW: 18 % — ABNORMAL HIGH (ref 11.5–15.5)

## 2010-08-03 LAB — BASIC METABOLIC PANEL
BUN: 4 mg/dL — ABNORMAL LOW (ref 6–23)
Calcium: 7.4 mg/dL — ABNORMAL LOW (ref 8.4–10.5)
Creatinine, Ser: 0.7 mg/dL (ref 0.50–1.35)
GFR calc Af Amer: >60 mL/min (ref >60)
GFR calc non Af Amer: >60 mL/min (ref >60)
Glucose, Bld: 94 mg/dL (ref 70–99)

## 2010-08-04 ENCOUNTER — Inpatient Hospital Stay (HOSPITAL_COMMUNITY): Payer: Medicaid Other

## 2010-08-04 DIAGNOSIS — T8140XA Infection following a procedure, unspecified, initial encounter: Secondary | ICD-10-CM | POA: Insufficient documentation

## 2010-08-04 LAB — CBC
MCH: 29.2 pg (ref 26.0–34.0)
MCHC: 33.3 g/dL (ref 30.0–36.0)
RDW: 17.4 % — ABNORMAL HIGH (ref 11.5–15.5)

## 2010-08-04 LAB — COMPREHENSIVE METABOLIC PANEL
ALT: <5 U/L (ref 0–53)
Albumin: 1.7 g/dL — ABNORMAL LOW (ref 3.5–5.2)
Alkaline Phosphatase: 96 U/L (ref 39–117)
Calcium: 7.4 mg/dL — ABNORMAL LOW (ref 8.4–10.5)
GFR calc Af Amer: >60 mL/min (ref >60)
Potassium: 3.5 mEq/L (ref 3.5–5.1)
Sodium: 133 mEq/L — ABNORMAL LOW (ref 135–145)
Total Protein: 4.8 g/dL — ABNORMAL LOW (ref 6.0–8.3)

## 2010-08-04 NOTE — Op Note (Signed)
  [name], CEDRONE NO.:  [id number]  MEDICAL RECORD NO.:  [id number]  LOCATION:  4709                         FACILITY:  [facility]  PHYSICIAN:  Eulas Post, MD    DATE OF BIRTH:  November 29, 1960  DATE OF PROCEDURE:  07/31/2010 DATE OF DISCHARGE:                              OPERATIVE REPORT   ATTENDING SURGEON:  Eulas Post, MD  ASSISTANT:  Janace Litten, orthopedic PA-C  PREOPERATIVE DIAGNOSIS:  Left infected hemiarthroplasty shoulder.  POSTOPERATIVE DIAGNOSIS:  Left infected hemiarthroplasty shoulder.  OPERATIVE PROCEDURE:  Incision and debridement with wound VAC change, left infected shoulder including skin, muscle, bone, prosthesis with removal of humeral head, sterilization of head, and replacement.  PREOPERATIVE INDICATIONS:  Mr. [name] is a 50 year old gentleman who had a postoperative infection after a prosthetic long stem revision hemiarthroplasty with ORIF of the humeral shaft.  He had a wound VAC placed after the first debridement, and was here today for his second debridement.  The risks, benefits, and alternatives were discussed before the procedure including but not limited to risks of recurrent infection, bleeding, nerve injury, osteomyelitis, the need for removal of all of the hardware, the potential for flail shoulder, the potential for amputation, cardiopulmonary complications, among others and he is willing to proceed.  OPERATIVE PROCEDURE:  The patient was brought to the operating room and placed in supine position.  He had already been on a IV antibiotics protocol.  The so far had been growing out methicillin-sensitive Staph aureus.  He was placed in a beach-chair position after general anesthesia.  The left upper extremity was prepped and draped in the usual sterile fashion.  The wound VAC was removed prior to draping.  I explored the wound, and there was still some purulent necrotic tissue around the humeral head.  The  humeral head was removed.  All necrotic tissue was removed.  I used curettes as well as rongeur to remove any abnormal-appearing tissue.  This was also curetted up and down around the plate.  After complete debridement was performed including the bone, as well as the capsule of the glenoid as well as the undersurface of the rotator cuff, I irrigated a total of 9 liters through the shoulder.  The humeral head was sent for resterilization.  I cleaned the Morse taper. Once all this had been achieved, I then replaced the humeral head. After 9 liters had been irrigated through, I then placed a wound VAC. Excellent seal was achieved.  The patient was then awakened and returned to the PACU in stable and satisfactory condition.  Janace Litten, orthopedic PA-C was present and scrubbed throughout the case and critical for completion with exposure with assistance as well as retraction and assistance with placement of the wound VAC.     Eulas Post, MD     JPL/MEDQ  D:  07/31/2010  T:  08/01/2010  Job:  [id number]  Electronically Signed by Teryl Lucy MD on 08/04/2010 05:04:43 PM

## 2010-08-05 LAB — BASIC METABOLIC PANEL
BUN: <3 mg/dL — ABNORMAL LOW (ref 6–23)
Chloride: 101 mEq/L (ref 96–112)
Creatinine, Ser: 0.59 mg/dL (ref 0.50–1.35)
GFR calc Af Amer: >60 mL/min (ref >60)
Glucose, Bld: 97 mg/dL (ref 70–99)
Potassium: 3 mEq/L — ABNORMAL LOW (ref 3.5–5.1)

## 2010-08-05 LAB — CBC
HCT: 24.4 % — ABNORMAL LOW (ref 39.0–52.0)
Hemoglobin: 8.1 g/dL — ABNORMAL LOW (ref 13.0–17.0)
MCHC: 33.2 g/dL (ref 30.0–36.0)
MCV: 87.8 fL (ref 78.0–100.0)
RDW: 17.3 % — ABNORMAL HIGH (ref 11.5–15.5)
WBC: 6.8 10*3/uL (ref 4.0–10.5)

## 2010-08-06 LAB — BASIC METABOLIC PANEL
BUN: 3 mg/dL — ABNORMAL LOW (ref 6–23)
Calcium: 7.7 mg/dL — ABNORMAL LOW (ref 8.4–10.5)
Creatinine, Ser: 0.69 mg/dL (ref 0.50–1.35)
GFR calc Af Amer: >60 mL/min (ref >60)
GFR calc non Af Amer: >60 mL/min (ref >60)
Glucose, Bld: 108 mg/dL — ABNORMAL HIGH (ref 70–99)

## 2010-08-06 LAB — CBC
HCT: 27.1 % — ABNORMAL LOW (ref 39.0–52.0)
Hemoglobin: 8.9 g/dL — ABNORMAL LOW (ref 13.0–17.0)
MCH: 28.9 pg (ref 26.0–34.0)
MCHC: 32.8 g/dL (ref 30.0–36.0)
RDW: 17.6 % — ABNORMAL HIGH (ref 11.5–15.5)

## 2010-08-07 DIAGNOSIS — T8450XA Infection and inflammatory reaction due to unspecified internal joint prosthesis, initial encounter: Secondary | ICD-10-CM

## 2010-08-07 DIAGNOSIS — A4901 Methicillin susceptible Staphylococcus aureus infection, unspecified site: Secondary | ICD-10-CM

## 2010-08-07 DIAGNOSIS — Z96619 Presence of unspecified artificial shoulder joint: Secondary | ICD-10-CM

## 2010-08-07 LAB — BASIC METABOLIC PANEL
Calcium: 8.1 mg/dL — ABNORMAL LOW (ref 8.4–10.5)
GFR calc Af Amer: >60 mL/min (ref >60)
GFR calc non Af Amer: >60 mL/min (ref >60)
Glucose, Bld: 127 mg/dL — ABNORMAL HIGH (ref 70–99)
Potassium: 3.5 mEq/L (ref 3.5–5.1)
Sodium: 132 mEq/L — ABNORMAL LOW (ref 135–145)

## 2010-08-07 LAB — CBC
Hemoglobin: 9.9 g/dL — ABNORMAL LOW (ref 13.0–17.0)
MCH: 28.9 pg (ref 26.0–34.0)
MCHC: 32.9 g/dL (ref 30.0–36.0)
Platelets: 531 10*3/uL — ABNORMAL HIGH (ref 150–400)
RDW: 17.5 % — ABNORMAL HIGH (ref 11.5–15.5)

## 2010-08-08 DIAGNOSIS — F1910 Other psychoactive substance abuse, uncomplicated: Secondary | ICD-10-CM | POA: Insufficient documentation

## 2010-08-08 DIAGNOSIS — K922 Gastrointestinal hemorrhage, unspecified: Secondary | ICD-10-CM | POA: Insufficient documentation

## 2010-08-08 DIAGNOSIS — T8140XA Infection following a procedure, unspecified, initial encounter: Secondary | ICD-10-CM

## 2010-08-08 DIAGNOSIS — F329 Major depressive disorder, single episode, unspecified: Secondary | ICD-10-CM | POA: Insufficient documentation

## 2010-08-08 DIAGNOSIS — D62 Acute posthemorrhagic anemia: Secondary | ICD-10-CM | POA: Insufficient documentation

## 2010-08-09 DIAGNOSIS — Z8719 Personal history of other diseases of the digestive system: Secondary | ICD-10-CM | POA: Insufficient documentation

## 2010-08-09 DIAGNOSIS — S42202B Unspecified fracture of upper end of left humerus, initial encounter for open fracture: Secondary | ICD-10-CM | POA: Insufficient documentation

## 2010-08-09 LAB — BASIC METABOLIC PANEL
CO2: 26 mEq/L (ref 19–32)
Calcium: 7.7 mg/dL — ABNORMAL LOW (ref 8.4–10.5)
GFR calc non Af Amer: >60 mL/min (ref >60)
Potassium: 4.2 mEq/L (ref 3.5–5.1)
Sodium: 130 mEq/L — ABNORMAL LOW (ref 135–145)

## 2010-08-09 LAB — CBC
MCH: 29.4 pg (ref 26.0–34.0)
Platelets: 362 10*3/uL (ref 150–400)
RBC: 2.96 MIL/uL — ABNORMAL LOW (ref 4.22–5.81)

## 2010-08-09 NOTE — Discharge Summary (Signed)
[name], [name] NO.:  [id number]  MEDICAL RECORD NO.:  [id number]  LOCATION:  5006                         FACILITY:  [facility]  PHYSICIAN:  Thad Ranger, MD       DATE OF BIRTH:  [birth year]  DATE OF ADMISSION:  07/25/2010 DATE OF DISCHARGE:                              DISCHARGE SUMMARY   PRIMARY CARE PHYSICIAN:  Assigned to [facility]  DISCHARGE DIAGNOSES: 1. Alcohol intoxication with alcohol abuse. 2. History of withdrawals with detox. 3. Falls. 4. Left upper extremity abscess and prosthesis infection, status post     incision and drainage x3.  Now drain out. 5. Falls secondary to alcohol intoxication. 6. Hypokalemia, replaced. 7. History of polysubstance abuse and nicotine abuse. 8. Left shoulder and arm pain secondary to the left upper extremity     abscess. 9. 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 10.Ileus with esophagitis, improved.  CONSULTATIONS: 1. Orthopedic, Eulas Post, MD 2. Infectious Disease, Cliffton Asters, MD 3. Psychiatry, Eulogio Ditch, MD.  DISCHARGE MEDICATIONS: 1. Tylenol Extra Strength 500 mg p.o. q.i.d. 2. Xanax 0.25 mg p.o. b.i.d. 3. Ancef 2 g IV every 8 hours until September 07, 2010, then start the     p.o. Keflex. 4. Colace 100 mg p.o. b.i.d. 5. Ferrous gluconate 324 mg p.o. daily. 6. Folic acid 1 mg p.o. daily. 7. Dilaudid 2-6 mg p.o. every 4 hours as needed for pain. 8. Ativan 1 mg p.o. t.i.d. as needed for anxiety. 9. Robaxin 750 mg p.o. q.i.d. 10.Multivitamin one tablet p.o. daily. 11.NicoDerm patch 21 mg transdermal daily at bedtime. 12.Protonix 40 mg p.o. twice daily. 13.Promethazine 6.25 mg p.o. every 6 hours as needed for     nausea/vomiting. 14.Rifampin 600 mg p.o. daily at bedtime.  Continue for 4-6 months     initially with Ancef until September 07, 2010, then start Keflex and     rifampin for total of 4-6 months. 15.Senokot-S 8.6/50 two tablets  p.o. daily at bedtime as needed. 16.Thiamine 100 mg p.o. daily. 17.Citalopram 40 mg p.o. daily. 18.Gabapentin 300 mg p.o. t.i.d.  BRIEF HISTORY OF PRESENT ILLNESS:  At the time of admission, [name] is a 50 year old male with history of heavy alcohol abuse.  Previously, he had nicotine abuse, polysubstance abuse who was brought in by the EMS while falling at home.  The patient appeared to be intoxicated at the time of admission and also stated that he had been falling a lot and fell a week prior to admission.  He stated that he [place] likely because of his alcohol intoxication and had left proximal humerus dislocation.  The patient to mental open reduction and internal sensation of the shaft with periprostatic lesion.  He stated that he needed pain medications for his left arm and it hurts 10/10 in intensity hence he kept drinking.  The patient was lying on the palliative floor witnessed by his mother and his shoulder was bleeding, so he called the EMS and the patient was brought to the [facility] Emergency Room.  RADIOLOGICAL DATA: 1. Left shoulder x-ray; no acute fracture or left shoulder  replacement. 2. CT of head without contrast on July 25, 2010; no evidence of acute     cervical spine fracture, traumatic subluxation, no objective signs     of instability, moderate spondylosis. 3. CT spine on July 25, 2010; no evidence of acute cervical spine     fracture, traumatic subluxation, no objective signs of instability     moderate spondylosis. 4. CT of head, no acute intracranial or calvarial findings. 5. Left humerus x-ray on July 26, 2010; similar appearance of     postsurgical changes without acute findings. 6. Chest x-ray one-view on July 27, 2010; an emphysema without acute     cardiopulmonary disease. 7. CT showed a left with contrast on July 27, 2010; diffuse     inflammatory changes of the anterior shoulder in the postoperative     setting slightly represent  infection with or without hematoma.     This probably represents a large abscess with gas in the soft     tissues.  The deltoid appears pyomyositis of the deltoid is likely,     gas is present deep to the skin staples likely representing abscess     in this region.  In the inferior left mainstem bronchus, there is a     soft tissues density nodule in the nondependent more likely     representing tenacious secretions. 8. Abdominal x-ray on August 01, 2010; gas with top normal-sized,     nonenlarged bowel, maybe normal versus mild ileus. 9. Abdominal x-ray on August 03, 2010; gas with mid abdominal small-     bowel loops without evidence of obstruction. 10.Abdominal x-ray on August 04, 2010; grossly unchanged mild gaseous     distention of large and small bowel without definitive evidence of     obstruction.  PERTINENT LAB AND DIAGNOSTIC DATA:  BMET at the time of admission; sodium 141, potassium 2.4, BUN 3, creatinine 1.1.  Alcohol level 93 at the time of admission.  Anemia panel showed iron-deficiency anemia.  CRP elevated at 241.3.  CBC on July 27, 2010; showed white count of 21.9 with a hemoglobin of 8.9.  Surgical PCR was positive for Staph aureus,but negative for MRSA.  Wound culture on July 28, 2010, showed moderate Staph aureus sensitive to oxacillin.  Multiple wound and tissue cultures were positive for MSSA.  Blood cultures remain negative until date. BMET at the time of dictation, sodium 132, potassium 3.5, BUN 5, creatinine 0.7.  WBCs 8.1, hemoglobin 9.9, hematocrit 30.1, platelets 531.  BRIEF HOSPITALIZATION COURSE:  Mr. Gelpi is a 50 year old male with history of heavy alcohol abuse and falls secondary from alcohol use, recent left shoulder dislocation with repair, presented with fall.  The patient, however, was also acutely intoxicated at the time of admission and was disheveled and in pain. 1. Alcohol intoxication with abuse and detox.  The patient was     initially admitted  to the monitored floor given hypokalemia and     likelihood of DTs.  The patient was placed on IV fluids, thiamine,     folic acid, and multivitamins.  Psychiatry consultation was also     obtained and given his chronic alcohol dependence, chronic     marijuana use, depressive disorder, the patient was placed back on     Celexa, which was increased to 40 mg daily and Neurontin.  The     patient at that time refused to go to inpatient rehab like [facility] for     alcohol detox.  He was referred to the Outpatient Rehab at [facility].     The patient, however, developed issues with his left upper     extremities and has completed the detox while inpatient here during     the hospitalization.  He should continue thiamine, folic acid, and     his psychiatry medications as recommended by Psychiatry Service. 2. Systemic inflammatory response syndrome with left upper extremity     abscess and acute left arm pain with methicillin-susceptible     Staphylococcus aureus, left prosthesis and infection.  On July 26, 2010, Orthopedics was consulted for this followup and stability of     his left upper extremity prosthesis.  The patient was seen by Dr.     Teryl Lucy.  On June 27, 2010, the very next day, the patient's     white count was elevated at 20.4 and left upper extremity Dopplers     were obtained, which showed no deep venous thrombosis.  The patient     was at this point started on vancomycin, Zosyn, and Flagyl and stat     CT of his left upper extremity was obtained, which did show a large     abscess with gas and myositis.  This was notified to Orthopedics     and the patient underwent first I and D on July 28, 2010.     Subsequently, he had two or more I and Ds.  Tissue cultures and     wound cultures showed gram-positive cocci, methicillin-susceptible     Staphylococcus aureus.  The drain was removed on August 07, 2010.     The patient was also followed closely by Infectious Disease, Dr.      Cliffton Asters.  At this point, the drain has been removed and per     ID recommendations, the patient should continue Ancef and rifampin     through September 07, 2010, then changed to Keflex likely 500 t.i.d.     with rifampin for 4-6 months total.  The patient has ID Clinic     follow up with Dr. Orvan Falconer on August 22, 2010 at 9:45 a.m.  During     the hospitalization, the patient had the pain medication/drug-     seeking behavior especially with IV pain medications.  He was then     subsequently transitioned to p.o. pain medications and all the IV     pain medications were discontinued. 3. History of falls with his depression, alcohol abuse.  PT/OT     consultation was also obtained and per Ortho recommendations, he     will benefit from skilled nursing facility for the dressing changes     and wound care daily.  He needs IV antibiotics until September 07, 2010, and to continue rehab for the arm. 4. Ileus with esophagitis.  During hospitalization, the patient     continued to complain of nausea and multiple abdominal KUBs were     obtained.  Likely, he had adynamic ileus secondary to the IV     narcotics.  He was placed on n.p.o. status for NG tube suction,     which he refused.  He is currently significantly improved and is     tolerating regular diet without any difficulty.  The patient will     be discharged to skilled nursing facility pending bed available.  PHYSICAL EXAMINATION:  VITAL SIGNS:  At the time of my dictation, temperature 98.1,  pulse 102, respirations 20, blood pressure 106/64, O2 sats 94% on room air. GENERAL:  The patient is alert, awake and oriented x3 not in acute distress. HEENT:  Anicteric sclerae and conjunctivae.  Pupils are reactive to light and accommodation.  EOMI. NECK:  Supple. CHEST:  Clear to auscultation bilaterally. ABDOMEN:  Soft, nontender and normal bowel sounds. EXTREMITIES:  Left upper extremity dressing intact.  No drain or wound VAC at this  time.  DISCHARGE FOLLOWUP:  INFECTIOUS Disease Dr. Cliffton Asters on August 22, 2010.  [facility] on September 14, 2010 with at 3 p.m.  [facility] on September 07, 2010 at 3:30 p.m. and Dr. Dion Saucier in 1 week from the hospitalization.  TIME SPENT:  Fifty minutes.     Thad Ranger, MD     RR/MEDQ  D:  08/08/2010  T:  08/08/2010  Job:  [id number]  cc:   Cliffton Asters, M.D. Eulas Post, MD  Electronically Signed by Andres Labrum Arihant Pennings  on 08/09/2010 01:29:47 PM

## 2010-08-10 NOTE — Op Note (Signed)
  [name], [name] NO.:  [id number]  MEDICAL RECORD NO.:  [id number]  LOCATION:  5006                         FACILITY:  [facility]  PHYSICIAN:  Eulas Post, MD    DATE OF BIRTH:  1960/10/12  DATE OF PROCEDURE:  08/04/2010 DATE OF DISCHARGE:                              OPERATIVE REPORT   ATTENDING SURGEON:  Eulas Post, MD.  FIRST ASSISTANT:  Janace Litten, OPA.  PREOPERATIVE DIAGNOSIS:  Infected left shoulder hemiarthroplasty.  POSTOPERATIVE DIAGNOSIS:  Infected left shoulder hemiarthroplasty.  OPERATIVE PROCEDURE:  Repeat irrigation, debridement, removal of wound Vac, and delayed primary closure of 23-cm wound.  PREOPERATIVE INDICATIONS:  [name] is a 50 year old gentleman who had a periprosthetic left humerus fracture that underwent revision long stem cemented and hemiarthroplasty with cable cerclage, ORIF of the humerus fracture.  He subsequently came back infected.  He has had multiple serial debridements, and here today for delayed primary closure and repeat I and D.  The risks, benefits, and alternatives were discussed with him before the procedure including not limited to risks of recurrent infection, bleeding, nerve injury, the potential for the need for removal of his hemiarthroplasty, cardiopulmonary complications, among others.  He is willing to proceed.  OPERATIVE PROCEDURE:  The patient was brought to the operating room, placed in supine position.  He has grown out methicillin-sensitive staph aureus and is currently on an Ancef regimen.  He was placed in a beach- chair position after general anesthesia was administered.  Left upper extremity was prepped and draped in the usual sterile fashion.  The wound Vac was removed.  I used a curette as well as a rongeur and debrided any abnormal-appearing tissue.  Overall, the wound was looking remarkably good.  There was still a little bit of abnormal tissue right around the proximal  aspect of the plate, and a little bit deep within the joint itself near the infraspinatus tendon.  This was all debrided and cleaned.  I removed the humeral head as well and this was autoclaved.  I then irrigated a total of 9 liters through the shoulder and all of the wounds.  Once complete irrigation and debridement was carried out of the bone, skin, subcutaneous muscle, and fascia, I replaced the humeral head and reduced the shoulder.  I then repaired the deltopectoral interval with Prolene, closing a layer of soft tissue over the prosthesis.  I then repaired the skin with nylon.  Sterile gauze was applied.  I did placed deep drains, although, I closed the wound so loosely, but the drains may not have too much of an effect, as the skin is reapproximated, but certainly not watertight.  Once this had been completed, the patient was awakened and returned to PACU in stable and satisfactory condition. There were no complications.  He tolerated the procedure well.     Eulas Post, MD     JPL/MEDQ  D:  08/04/2010  T:  08/05/2010  Job:  [id number]  Electronically Signed by Teryl Lucy MD on 08/10/2010 10:35:33 AM

## 2010-08-11 ENCOUNTER — Inpatient Hospital Stay (HOSPITAL_COMMUNITY)
Admission: EM | Admit: 2010-08-11 | Discharge: 2010-08-18 | DRG: 560 | Disposition: A | Discharge status: Home Health | Payer: Medicaid Other | Attending: Internal Medicine | Admitting: Internal Medicine

## 2010-08-11 ENCOUNTER — Emergency Department (HOSPITAL_COMMUNITY): Payer: Medicaid Other

## 2010-08-11 DIAGNOSIS — Z9119 Patient's noncompliance with other medical treatment and regimen: Secondary | ICD-10-CM

## 2010-08-11 DIAGNOSIS — D6489 Other specified anemias: Secondary | ICD-10-CM | POA: Diagnosis present

## 2010-08-11 DIAGNOSIS — F1010 Alcohol abuse, uncomplicated: Secondary | ICD-10-CM | POA: Diagnosis present

## 2010-08-11 DIAGNOSIS — E876 Hypokalemia: Secondary | ICD-10-CM | POA: Diagnosis present

## 2010-08-11 DIAGNOSIS — Y831 Surgical operation with implant of artificial internal device as the cause of abnormal reaction of the patient, or of later complication, without mention of misadventure at the time of the procedure: Secondary | ICD-10-CM | POA: Diagnosis present

## 2010-08-11 DIAGNOSIS — F1910 Other psychoactive substance abuse, uncomplicated: Secondary | ICD-10-CM | POA: Diagnosis present

## 2010-08-11 DIAGNOSIS — E871 Hypo-osmolality and hyponatremia: Secondary | ICD-10-CM | POA: Diagnosis present

## 2010-08-11 DIAGNOSIS — Z91199 Patient's noncompliance with other medical treatment and regimen due to unspecified reason: Secondary | ICD-10-CM

## 2010-08-11 DIAGNOSIS — T8450XA Infection and inflammatory reaction due to unspecified internal joint prosthesis, initial encounter: Principal | ICD-10-CM | POA: Diagnosis present

## 2010-08-11 DIAGNOSIS — F1020 Alcohol dependence, uncomplicated: Secondary | ICD-10-CM | POA: Diagnosis present

## 2010-08-11 DIAGNOSIS — Z9181 History of falling: Secondary | ICD-10-CM

## 2010-08-11 DIAGNOSIS — Z96619 Presence of unspecified artificial shoulder joint: Secondary | ICD-10-CM

## 2010-08-11 LAB — COMPREHENSIVE METABOLIC PANEL
AST: 35 U/L (ref 0–37)
Albumin: 2.5 g/dL — ABNORMAL LOW (ref 3.5–5.2)
BUN: 4 mg/dL — ABNORMAL LOW (ref 6–23)
Calcium: 8.5 mg/dL (ref 8.4–10.5)
Chloride: 86 mEq/L — ABNORMAL LOW (ref 96–112)
Creatinine, Ser: 0.65 mg/dL (ref 0.50–1.35)
Total Bilirubin: 0.2 mg/dL — ABNORMAL LOW (ref 0.3–1.2)
Total Protein: 6.6 g/dL (ref 6.0–8.3)

## 2010-08-11 LAB — RAPID URINE DRUG SCREEN, HOSP PERFORMED
Barbiturates: NOT DETECTED
Benzodiazepines: NOT DETECTED
Cocaine: NOT DETECTED
Opiates: NOT DETECTED
Tetrahydrocannabinol: NOT DETECTED

## 2010-08-11 LAB — LACTIC ACID, PLASMA: Lactic Acid, Venous: 6.5 mmol/L — ABNORMAL HIGH (ref 0.5–2.2)

## 2010-08-11 LAB — CBC
Hemoglobin: 10.7 g/dL — ABNORMAL LOW (ref 13.0–17.0)
RBC: 3.68 MIL/uL — ABNORMAL LOW (ref 4.22–5.81)

## 2010-08-11 LAB — DIFFERENTIAL
Basophils Absolute: 0 10*3/uL (ref 0.0–0.1)
Basophils Relative: 0 % (ref 0–1)
Lymphocytes Relative: 10 % — ABNORMAL LOW (ref 12–46)
Monocytes Relative: 8 % (ref 3–12)
Neutro Abs: 11.2 10*3/uL — ABNORMAL HIGH (ref 1.7–7.7)
Neutrophils Relative %: 82 % — ABNORMAL HIGH (ref 43–77)

## 2010-08-12 LAB — BASIC METABOLIC PANEL
BUN: 4 mg/dL — ABNORMAL LOW (ref 6–23)
CO2: 27 mEq/L (ref 19–32)
Calcium: 7.7 mg/dL — ABNORMAL LOW (ref 8.4–10.5)
Chloride: 98 mEq/L (ref 96–112)
Creatinine, Ser: 0.69 mg/dL (ref 0.50–1.35)

## 2010-08-12 LAB — CBC
HCT: 26.7 % — ABNORMAL LOW (ref 39.0–52.0)
MCH: 29.2 pg (ref 26.0–34.0)
MCV: 88.7 fL (ref 78.0–100.0)
RBC: 3.01 MIL/uL — ABNORMAL LOW (ref 4.22–5.81)
RDW: 17.9 % — ABNORMAL HIGH (ref 11.5–15.5)
WBC: 8.1 10*3/uL (ref 4.0–10.5)

## 2010-08-12 NOTE — Consult Note (Signed)
NAMEHENRY, UTSEY NO.:  [id number]  MEDICAL RECORD NO.:  [id number]  LOCATION:  1425                         FACILITY:  [facility]  PHYSICIAN:  Eulas Post, MD    DATE OF BIRTH:  Oct 28, 1960  DATE OF CONSULTATION:  08/11/2010 DATE OF DISCHARGE:                                CONSULTATION   CHIEF COMPLAINT:  Left shoulder pain.  HISTORY:  [name] is a 50 year old well-known to me that we have been struggling to manage his left proximal humerus fracture dislocation that underwent revision long-stem cemented hemiarthroplasty who subsequently developed an infection and was recently discharged to skilled nursing facility for the management of his PICC line, IV antibiotics, and narcotic medication.  He has a long history of substance abuse and alcoholism.  He comes in today back to the hospital. Yesterday, I spent greater than an hour trying to work a straight care for him after he left his care facility against medical advice.  This was not only against medical advice of the care facility, but additionally against my direct advice which I counseled him on the day prior to his discharge to that facility.  Nonetheless, he has left against medical advice and apparently had a fall last night after drinking again.  Orthopedic consultation was requested.  He had a methicillin-sensitive Staphylococcus aureus that grew out from his shoulder on the left side.  PHYSICAL EXAMINATION:  His surgical wounds actually do not look that bad.  The wounds are loosely approximated, as I would have expected, and there is no purulent drainage.  There is no significant cellulitis, and only marginal erythema at the wound site.  Overall, it actually looks better than I might have expected.  All fingers do flex, extend, and abduct.  He apparently has not had any documented fever according to the nurse and his current temperature is 97.  IMPRESSION:  Infected left shoulder  hemiarthroplasty, status post multiple debridements with loose closure, and indwelling PICC line with a goal of IV antibiotics for curative or alternatively suppressive treatment.  PLAN:  [name] continues to be frustrating to work with due to his difficulty following instructions, as well as the challenges facing him regarding his polysubstance abuse.  I do not see recurrence of purulence of his shoulder at the current time, however, since it is potentially possible.  I have recommended wound care as well as continuing his regimen of IV Ancef 2 g q.8 as per the instructions from Infectious Disease.  He needs to keep his wounds clean and we will be monitoring them to try and optimize his healing and hopefully not have to remove his prosthesis or alternatively if that does not work, then he would need an amputation.  The medical team is planning on admitting him for hyponatremia as well as the management of his antibiotics.  I do not think he needs any further surgical intervention at the current time and he has not yet failed the most recent debridement.As far as pain regimen goes, he has demanded escalating doses of Dilaudid and I have counseled him that we are not willing to continue increasing Dilaudid dosing, particularly in light of  his addictive history.  I have recommended Percocet 10/325 mg 1-2 tablets p.o. q.6 h. p.r.n. pain as well as oxycodone extended release 10 mg p.o. b.i.d. and Dilaudid 2 mg p.o. q.4 h. p.r.n. severe breakthrough pain.  I would not recommend increasing his narcotic regimen anywhere beyond that. Administration of IV narcotics, I do not believe is indicated at this point based on how long it has been since his last surgery and the aggressive oral regimen that I have recommended.     Eulas Post, MD     JPL/MEDQ  D:  08/11/2010  T:  08/12/2010  Job:  [id number]  cc:   Cliffton Asters, M.D. Fax: [phone]  Electronically Signed by Teryl Lucy MD  on 08/12/2010 09:47:55 AM

## 2010-08-13 LAB — BASIC METABOLIC PANEL
BUN: 5 mg/dL — ABNORMAL LOW (ref 6–23)
Calcium: 7.7 mg/dL — ABNORMAL LOW (ref 8.4–10.5)
Creatinine, Ser: 0.64 mg/dL (ref 0.50–1.35)
GFR calc non Af Amer: >60 mL/min (ref >60)
Glucose, Bld: 120 mg/dL — ABNORMAL HIGH (ref 70–99)

## 2010-08-14 ENCOUNTER — Ambulatory Visit: Payer: Self-pay | Admitting: Internal Medicine

## 2010-08-14 DIAGNOSIS — F4320 Adjustment disorder, unspecified: Secondary | ICD-10-CM

## 2010-08-15 DIAGNOSIS — F4320 Adjustment disorder, unspecified: Secondary | ICD-10-CM

## 2010-08-16 DIAGNOSIS — F4320 Adjustment disorder, unspecified: Secondary | ICD-10-CM

## 2010-08-17 LAB — CULTURE, BLOOD (ROUTINE X 2)
Culture  Setup Time: 201207271758
Culture: NO GROWTH
Culture: NO GROWTH

## 2010-08-18 DIAGNOSIS — F4320 Adjustment disorder, unspecified: Secondary | ICD-10-CM

## 2010-08-18 NOTE — H&P (Signed)
NAMEHARJAS, [name] NO.:  [id number]  MEDICAL RECORD NO.:  [id number]  LOCATION:  1425                         FACILITY:  [facility]  PHYSICIAN:  Tarry Kos, MD       DATE OF BIRTH:  07-May-1960  DATE OF ADMISSION:  08/11/2010 DATE OF DISCHARGE:                             HISTORY & PHYSICAL   CHIEF COMPLAINT:  Left shoulder pain.  HISTORY OF PRESENT ILLNESS:  [name] is a 50 year old male with significant history of heavy alcohol abuse who fractured his left proximal humerus in January 2011.  He subsequently had ORIF of that arm by Dr. Dion Saucier.  He subsequently in February 2011 after of this initial failed ORIF, he subsequently had removal of that hardware and was converted to the left shoulder hemiarthroplasty.  He has had multiple complications since that time, has had prosthetic infection, was admitted on July 25, 2010, because of intoxication, he had fallen and hurt his shoulder yet again.  At that time, his shoulder had been infected, he was discharged on August 08, 2010, after having the left upper extremity I & D'ed which had an abscess.  Since that time, he was discharged to rehab center.  However, Mr. Mode left the rehab center yesterday AMA because he states he does not need any further rehab.  He went home, drank a couple of beers and comes back to the ED today because his shoulder is extremely painful.  He still has a significant wound from his left humerus area, all the way down to the midpart of his arm, a good 5 or 6 cm of the upper part of the wound is open and he still has incisions intact.  There is no obvious purulent discharge, but the wound is still significant and not well healing since discharge last week.  [name] has told me that he has actually been sober for 6 months; however, according to his records, this is not the case.  He denies using any other drugs.  His main issue is the pain, he wants Dilaudid and Ativan, and he asked  this at least eight times during my interview with him.  I am assuming that he left AMA yesterday without any continuing antibiotics or pain medications or anxiolytics.  He was discharged on Ancef and rifampin last week.  He says he has not been running any fever since yesterday.  He came into the emergency department, was found to be tachycardic and mildly hypotensive with EMS; however, his blood pressure here is 116/71.  He is being admitted for wound care.  He does not appear to be significantly alcohol intoxicated at this time.  REVIEW OF SYSTEMS:  Otherwise, negative.  PAST MEDICAL HISTORY: 1. Again, left upper extremity humerus fracture with multiple     complications and subsequent abscess formation and prosthetic     infection as above, status post I and D of that with now chronic     wound who was completing antibiotics which grew out methicillin-     susceptible Staphylococcus aureus. 2. Significant history of alcohol abuse. 3. Frequent falls, likely secondary to alcohol abuse. 4. History of polysubstance abuse. 5. Ileus. 6.  History of prior GI bleed.  SOCIAL HISTORY:  He tried to tell me he has been sober for 6 months prior to yesterday.  Yesterday, he says he drank a couple of beers but of course according to records he has been intoxicated here several times over the last couple of months.  He does smoke.  He denies any alcohol.  He is wanting to be discharged home with Dilaudid and Ativan, and I have told him that I would not do that, that he would have to leave AMA and that he needed further rehabilitation.  He says he does not want to go to the same rehab he was at prior, so he is willing to consider going to a different rehab center.  ALLERGIES:  None.  MEDICATIONS:  He is not taking any since yesterday since he left the rehab facility against medical advice.  According to his discharge summary on August 08, 2010, he is taking: 1. Xanax 0.25 mg twice a day. 2.  Ancef 2 g IV every 8 hours until September 07, 2010, then he is     supposed to start p.o. Keflex. 3. Colace. 4. Iron sulfate. 5. Folic acid. 6. Dilaudid 2 to 6 mg p.o. every 4 hours as needed for pain. 7. Ativan 1 mg 3 times a day as needed for anxiety. 8. Robaxin 750 mg q.i.d. 9. Multivitamin a day. 10.NicoDerm patch. 11.Protonix. 12.Promethazine. 13.Rifampin 600 mg daily at bedtime.  He is to continue for 4 to 6     months initially with Ancef until September 07, 2010, and then start     Keflex and rifampin for total of 4 to 6 months. 14.Senokot. 15.Thiamine. 16.Citalopram. 17.Gabapentin.  PHYSICAL EXAMINATION:  VITAL SIGNS:  Here his O2 saturation 99% on room air, blood pressure 116/71, pulse 119, respirations 20, temperature 97.8. GENERAL:  He is alert and oriented.  He does not appear significantly intoxicated.  He answers the questions appropriately.  No apparent distress.  Chronically ill appearing and cachectic. HEENT:  Extraocular muscles are intact.  Pupils equal and reactive to light.  Oropharynx clear.  Mucous membranes moist. NECK:  No JVD.  No carotid bruits. HEART:  Regular rate and rhythm without murmurs or gallops. CHEST:  Clear to auscultation bilaterally, without wheezes, rhonchi, or rales. ABDOMEN:  Soft, nontender, nondistended.  Positive bowel sounds.  No visceromegaly. EXTREMITIES:  No clubbing, cyanosis, or edema. PSYCH:  Normal affect. NEURO:  No focal neurologic deficits. SKIN:  His left upper extremity has an incision from the humerus, down to about the midshaft.  The upper part of the wound is open.  There is no obvious drainage.  There is no surrounding erythema.  There are sutures still in place.  LABS:  Sodium is 128, potassium 2.5, BUN and creatinine of 4 and 0.65, chloride is 86.  Lactic acid is elevated at 6.5.  White count is 13.7, hemoglobin is 10.7.  Left shoulder film shows a stable appearance of the humeral head prosthesis, with no  fracture.  ASSESSMENT/PLAN:  This is a 50 year old male with left humeral head prosthetic infection with significant wound, and alcohol abuse who presents after leaving his rehab center [facility] yesterday. 1. Alcohol abuse with medical noncompliance.  We will provide him with     some multivitamins, thiamine, folate, and some IV fluids.  We will     provide him with some Ativan as needed also. 2. Left upper extremity prosthetic infection, status post significant     I and  D with significant wound now.  I am going to switch his     antibiotics to vancomycin and Zosyn for now until his blood     cultures are negative, and obtain orthopedic consult who has     already been notified by the emergency department.  Dr. Dion Saucier has     already been notified by the ED and is aware of the consult.  I     have recommended the patient going back to rehab for treatment as     he is at extremely high risk and I have explained to him that this     wound can cause a life-threatening infection.  He is aware of that,     however, I am not too sure how he will comply with further     rehabilitation inpatient. 3. Hyponatremia, likely secondary to his alcohol issues.  We will     place on normal saline, repeat in the morning. 4. Hypokalemia.  We will also replace that. 5. We will also check a urine drug screen to check for other substance     abuse. 6. The patient is full code. 7. Further recommendation depending on hospital course.          ______________________________ Tarry Kos, MD     RD/MEDQ  D:  08/11/2010  T:  08/11/2010  Job:  [id number]  Electronically Signed by Tarry Kos MD on 08/18/2010 12:13:12 PM

## 2010-08-22 ENCOUNTER — Ambulatory Visit: Payer: Self-pay | Admitting: Internal Medicine

## 2010-08-22 NOTE — Discharge Summary (Signed)
NAMEEURIAH, MATLACK NO.:  [id number]  MEDICAL RECORD NO.:  [id number]  LOCATION:  1425                         FACILITY:  [facility]  PHYSICIAN:  Hillery Aldo, M.D.   DATE OF BIRTH:  May 01, 1960  DATE OF ADMISSION:  08/23/2010 DATE OF DISCHARGE:  08/18/2010                              DISCHARGE SUMMARY   PRIMARY CARE PHYSICIAN:  Dr. Andrey Campanile at [facility].  ORTHOPEDIC SURGEON:  Eulas Post, MD  DISCHARGE DIAGNOSES: 1. Left shoulder prosthesis infection. 2. Alcohol abuse. 3. Chronic normocytic anemia. 4. History of falls. 5. History of medical noncompliance. 6. Polysubstance abuse. 7. Mild hyponatremia.  DISCHARGE MEDICATIONS: 1. Librium taper, 25 mg b.i.d. for 3 days, then daily for 1 week, then     discontinue. 2. Dilaudid 2 mg p.o. q.6 h. p.r.n. pain with instructions to taper to     one q.8 h. after 1 week, then 1 q.12 h. after 1 week and     instruction that this medication will not be used long-term for     chronic pain.  Number 120 tabs written for with no refills. 3. Extra Strength Tylenol 500 mg p.o. q.i.d. 4. Citalopram 40 mg p.o. daily. 5. Cefazolin 2 g IV q.8 h. to be given by home health nurses for 20     days. 6. Docusate sodium 100 mg p.o. b.i.d. 7. Ferrous gluconate 324 mg p.o. daily. 8. Folic acid 1 mg p.o. daily. 9. Gabapentin 300 mg p.o. t.i.d. 10.Multivitamin with mineral 1 tablet p.o. daily. 11.Nicotine patch 21 mg transdermally nightly. 12.OxyContin 10 mg p.o. q.12 h., #60 written for with no refills. 13.Protonix 40 mg p.o. b.i.d. 14.Phenergan 12.5 mg, half tablet p.o. q.6 h. P.r.n. nausea. 15.Rifampin 600 mg p.o. nightly for 5 months. 16.Senokot-S 2 tablets p.o. nightly p.r.n. constipation. 17.Thiamine 100 mg p.o. daily.  CONSULTATIONS: 1. Eulas Post, MD, of Orthopedic Surgery. 2. Eulogio Ditch, MD, of Psychiatry. 3. Physical Therapy. 4. Clinical Social Work.  BRIEF ADMISSION HISTORY OF PRESENT ILLNESS:   The patient is a 50 year old male with past medical history of alcoholism and a fractured left proximal humerus, dating back to January 2011.  He subsequently underwent an open reduction and internal fixation of the fracture and developed a postoperative infection, requiring removal of the hardware. He subsequently underwent a left shoulder arthroplasty and has had multiple complications with postoperative wound infection and prosthetic infection since that time.  The patient ultimately was a patient at [facility] from July 10th through July 24th where he was treated with IV antibiotics and subsequently discharged to a skilled nursing facility for ongoing antibiotic administration.  He left that facility against medical advice and ultimately ended up back in the ER complaining of shoulder pain and was referred to the hospitalist service for further evaluation and treatment.  For full details, please see the dictated report done by Dr. Onalee Hua.  PROCEDURES AND DIAGNOSTIC STUDIES:  Left shoulder films on 23-Aug-2010, showed stable appearance of the humeral head prosthesis with no definite acute fracture.  DISCHARGE LABORATORY VALUES:  Blood cultures were negative.  Sodium is 132, potassium 4.4, chloride 103, bicarbonate 25, BUN 5, creatinine  0.64, glucose 120, calcium 7.7. White blood cell count was 8.1, hemoglobin 8.8, hematocrit 26.7, platelets 387.  Procalcitonin on admission was less than 0.10.  Urine drug screen on admission was negative.  Serum lactic acid was elevated at 6.5 on admission.  HOSPITAL COURSE BY PROBLEM: 1. Chronic left shoulder prosthetic wound infection:  The patient was     put back on his antibiotic regimen, consisting of Ancef q.8 h. as     well as p.o. rifampin.  He is to continue this regimen through     September 07, 2010, with plans to switch him over to oral antibiotics     on September 07, 2010, consisting of Keflex and rifampin.  Keflex will      need to be continued for a total of 4-6 months.  Note:  A     prescription has not been written for the Keflex as it is not clear     that he will be compliant and he will need to follow up with Dr.     Dion Saucier and his treating physicians at [facility] to obtain this     prescription.  The patient was seen in consultation with Dr. Dion Saucier     and he should continue to receive dressing changes per the home     health nursing staff.  He should follow up with Dr. Dion Saucier in 1     week.  He will be discharged home with home health nursing to     administer IV antibiotic therapy for the remainder of his treatment     course. 2. Alcohol abuse/polysubstance abuse:  The patient's urine drug screen     was negative for illicit substances.  Nevertheless, he did admit to     imbibing his alcohol and because of this, a psychiatry consultation     was requested and provided by Dr. Rogers Blocker who recommended a     Librium taper.  The patient will be discharged on 3 additional days     of b.i.d. dosing of the Librium followed by 1 week of daily dosing     of Librium followed by discontinuation.  He is at high risk for     relapse with poor insight.3. Chronic normocytic anemia:  Likely related to his alcoholism and     chronic disease.  The patient was maintained on folic acid and iron     supplementation. 4. History of falls/medical noncompliance:  Again, he will likely be     at high risk for relapse with alcohol and noncompliance. 5. Mild hyponatremia:  Likely related to his alcoholism.  No further     workup or treatment indicated at this time.  DISCHARGE INSTRUCTIONS: 1. Activity:  Increase activity slowly. 2. Diet:  Regular. 3. Wound Care:  Mepilex to left shoulder with sterile 4 x 4 gauze to     the upper left arm and loosely cover arm with Ace wrap, to be     changed by the home health nurses daily. 4. Followup:  With Dr. Andrey Campanile at [facility] for an eligibility     appointment at August 14th  and a visit with Dr. Andrey Campanile on September     7th.  Follow up with Dr. Dion Saucier in 1 week.  The patient instructed     to call Dr. Shelba Flake office for an appointment time.  Time spent coordinating care for discharge and discharge instructions including face-to-face time equals approximately 35 minutes..  CONDITION AT DISCHARGE:  Stable.  Hillery Aldo, M.D.     CR/MEDQ  D:  08/18/2010  T:  08/19/2010  Job:  [id number]  cc:   Dr. Marlowe Alt, MD Fax: [phone]  Electronically Signed by Hillery Aldo M.D. on 08/22/2010 07:10:34 AM

## 2010-08-25 ENCOUNTER — Observation Stay (HOSPITAL_COMMUNITY)
Admission: EM | Admit: 2010-08-25 | Discharge: 2010-08-26 | Disposition: A | Discharge status: Home Health | Payer: Medicaid Other | Attending: Internal Medicine | Admitting: Internal Medicine

## 2010-08-25 DIAGNOSIS — Z8614 Personal history of Methicillin resistant Staphylococcus aureus infection: Secondary | ICD-10-CM | POA: Insufficient documentation

## 2010-08-25 DIAGNOSIS — F1027 Alcohol dependence with alcohol-induced persisting dementia: Secondary | ICD-10-CM | POA: Insufficient documentation

## 2010-08-25 DIAGNOSIS — M25519 Pain in unspecified shoulder: Secondary | ICD-10-CM | POA: Insufficient documentation

## 2010-08-25 DIAGNOSIS — X58XXXA Exposure to other specified factors, initial encounter: Secondary | ICD-10-CM | POA: Insufficient documentation

## 2010-08-25 DIAGNOSIS — F10229 Alcohol dependence with intoxication, unspecified: Principal | ICD-10-CM | POA: Insufficient documentation

## 2010-08-25 DIAGNOSIS — Z79899 Other long term (current) drug therapy: Secondary | ICD-10-CM | POA: Insufficient documentation

## 2010-08-25 DIAGNOSIS — E876 Hypokalemia: Secondary | ICD-10-CM | POA: Insufficient documentation

## 2010-08-25 DIAGNOSIS — Z9181 History of falling: Secondary | ICD-10-CM | POA: Insufficient documentation

## 2010-08-25 DIAGNOSIS — L089 Local infection of the skin and subcutaneous tissue, unspecified: Secondary | ICD-10-CM | POA: Insufficient documentation

## 2010-08-25 DIAGNOSIS — S42209A Unspecified fracture of upper end of unspecified humerus, initial encounter for closed fracture: Secondary | ICD-10-CM | POA: Insufficient documentation

## 2010-08-25 DIAGNOSIS — F1910 Other psychoactive substance abuse, uncomplicated: Secondary | ICD-10-CM | POA: Insufficient documentation

## 2010-08-25 LAB — HEPATIC FUNCTION PANEL
ALT: <5 U/L (ref 0–53)
AST: 19 U/L (ref 0–37)
Bilirubin, Direct: <0.1 mg/dL (ref 0.0–0.3)

## 2010-08-25 LAB — RAPID URINE DRUG SCREEN, HOSP PERFORMED
Barbiturates: NOT DETECTED
Cocaine: NOT DETECTED

## 2010-08-25 LAB — DIFFERENTIAL
Basophils Absolute: 0 10*3/uL (ref 0.0–0.1)
Basophils Relative: 1 % (ref 0–1)
Eosinophils Absolute: 0 10*3/uL (ref 0.0–0.7)
Eosinophils Relative: 0 % (ref 0–5)
Monocytes Absolute: 0.2 10*3/uL (ref 0.1–1.0)

## 2010-08-25 LAB — CBC
HCT: 37.7 % — ABNORMAL LOW (ref 39.0–52.0)
MCHC: 32.9 g/dL (ref 30.0–36.0)
Platelets: 332 10*3/uL (ref 150–400)
RDW: 17 % — ABNORMAL HIGH (ref 11.5–15.5)

## 2010-08-25 LAB — BASIC METABOLIC PANEL
Calcium: 8.6 mg/dL (ref 8.4–10.5)
GFR calc non Af Amer: >60 mL/min (ref >60)
Glucose, Bld: 99 mg/dL (ref 70–99)
Sodium: 138 mEq/L (ref 135–145)

## 2010-08-25 LAB — ETHANOL: Alcohol, Ethyl (B): 343 mg/dL — ABNORMAL HIGH (ref 0–11)

## 2010-08-26 LAB — BASIC METABOLIC PANEL
BUN: 14 mg/dL (ref 6–23)
Chloride: 102 mEq/L (ref 96–112)
Glucose, Bld: 103 mg/dL — ABNORMAL HIGH (ref 70–99)
Potassium: 4.9 mEq/L (ref 3.5–5.1)
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
## 2010-09-05 NOTE — H&P (Signed)
[name], [name] NO.:  [id number]  MEDICAL RECORD NO.:  [id number]  LOCATION:  1510                         FACILITY:  [facility]  PHYSICIAN:  Houston Siren, MD           DATE OF BIRTH:  1960/04/15  DATE OF ADMISSION:  08/25/2010 DATE OF DISCHARGE:                             HISTORY & PHYSICAL   PRIMARY CARE PHYSICIAN:  None.  ADVANCE DIRECTIVE:  Full code.  ORTHOPEDICS:  Eulas Post, MD  REASON FOR ADMISSION:  Alcohol intoxication and electrolyte abnormality.  HISTORY OF PRESENT ILLNESS:  This is a 50 year old male with significant history of alcohol abuse, polysubstance abuse, severe noncompliant, left upper extremity humeral fracture status post ORIF by Dr. Dion Saucier, subsequently developed infection with methicillin-sensitive Staphylococcus aureus, frequent intoxications, recurrent fallings, and recurrent infections, presents today to [facility] Emergency Room intoxicated with serum alcohol level of 343 and electrolyte abnormalities. He was recently discharged on August 4th, and at that time was placed on Ancef because of the infected, failed ORIF.  Evaluation shows his serum potassium of 2.6, and that he started having some withdrawal symptoms. Further workup included normal white count of 7,300, hemoglobin of 12.4, and creatinine 0.7.  Hospitalist was asked to admit the patient because of alcohol intoxication, mild withdrawal, and electrolyte abnormality.  PAST MEDICAL HISTORY: 1. 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 2. Significant alcohol abuse. 3. Frequent falls secondary to alcohol use. 4. Polysubstance abuse. 5. Ileus. 6. History of prior GI bleed.  SOCIAL HISTORY:  He drinks significantly and use tobacco as well.  He had been at rehab, but left AMA and does not want to go back to rehab.  ALLERGIES:  No known drug allergies.  CURRENT MEDICATIONS:  Discharged August 4th  list of medications include, 1. Librium tapering. 2. Dilaudid. 3. Citalopram. 4. Ancef 2 g IV q.8 h. 5. Folic acid. 6. Nicotine patch. 7. OxyContin. 8. Rifampin 600 mg nightly for 5 months. 9. Thiamine p.o. daily.  REVIEW OF SYSTEMS:  Otherwise unremarkable.  PHYSICAL EXAMINATION:  GENERAL:  The patient is lying in bed, in no apparent distress, but clearly does not want to talk. VITAL SIGNS:  Blood pressure 140/90, pulse of 100, respiratory rate 18, temperature of 98.4. EXTREMITIES:  The upper arm on the left side shows no swelling and no discharge.  He does have open wound, no withdrawal.  HEENT:  Sclerae nonicteric.  Throat is clear. NECK:  Supple. CARDIAC:  S1 and S2, regular.  I did not hear any murmur, rub, or gallop. LUNGS:  Clear. ABDOMEN:  Soft, nondistended, nontender. EXTREMITIES:  No edema.  No calf tenderness.  Good pulses bilaterally.  OBJECTIVE FINDINGS:  As above.  IMPRESSION:  This is a 50 year old male with significant alcohol abuse, presents to the emergency room intoxicated and likely will go into withdrawal.  We will place him on CIWA with Ativan p.o.  For his infection, we will continue his Ancef 2 g q.8 h. intravenously.  I will give him intravenous fluid and Dilaudid also.  He is a full code and as soon as his  medications are reconciled, we will continue them.  We will admit him to [facility].  He had had low potassium, will be repleted. We will check a BMET tomorrow as well.     Houston Siren, MD     PL/MEDQ  D:  08/26/2010  T:  08/26/2010  Job:  [id number]  Electronically Signed by Houston Siren  on 09/05/2010 09:48:28 PM
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
## 2010-09-28 ENCOUNTER — Ambulatory Visit: Payer: Self-pay | Admitting: Internal Medicine

## 2010-10-10 ENCOUNTER — Other Ambulatory Visit (HOSPITAL_COMMUNITY): Payer: Medicaid Other

## 2010-10-13 ENCOUNTER — Inpatient Hospital Stay (HOSPITAL_COMMUNITY)
Admission: RE | Admit: 2010-10-13 | Discharge: 2010-10-15 | DRG: 504 | Disposition: A | Discharge status: Home / Self Care | Payer: Medicaid Other | Source: Ambulatory Visit | Attending: Orthopedic Surgery | Admitting: Orthopedic Surgery

## 2010-10-13 ENCOUNTER — Inpatient Hospital Stay (HOSPITAL_COMMUNITY): Payer: Medicaid Other

## 2010-10-13 DIAGNOSIS — F17200 Nicotine dependence, unspecified, uncomplicated: Secondary | ICD-10-CM | POA: Diagnosis present

## 2010-10-13 DIAGNOSIS — W108XXA Fall (on) (from) other stairs and steps, initial encounter: Secondary | ICD-10-CM | POA: Diagnosis present

## 2010-10-13 DIAGNOSIS — Z96619 Presence of unspecified artificial shoulder joint: Secondary | ICD-10-CM

## 2010-10-13 DIAGNOSIS — S92009A Unspecified fracture of unspecified calcaneus, initial encounter for closed fracture: Principal | ICD-10-CM | POA: Diagnosis present

## 2010-10-13 DIAGNOSIS — T8450XA Infection and inflammatory reaction due to unspecified internal joint prosthesis, initial encounter: Secondary | ICD-10-CM | POA: Diagnosis present

## 2010-10-13 DIAGNOSIS — J449 Chronic obstructive pulmonary disease, unspecified: Secondary | ICD-10-CM | POA: Diagnosis present

## 2010-10-13 DIAGNOSIS — D62 Acute posthemorrhagic anemia: Secondary | ICD-10-CM | POA: Diagnosis present

## 2010-10-13 DIAGNOSIS — J4489 Other specified chronic obstructive pulmonary disease: Secondary | ICD-10-CM | POA: Diagnosis present

## 2010-10-13 DIAGNOSIS — F1910 Other psychoactive substance abuse, uncomplicated: Secondary | ICD-10-CM | POA: Diagnosis present

## 2010-10-13 DIAGNOSIS — I499 Cardiac arrhythmia, unspecified: Secondary | ICD-10-CM | POA: Diagnosis present

## 2010-10-13 LAB — COMPREHENSIVE METABOLIC PANEL
BUN: 12 mg/dL (ref 6–23)
CO2: 25 mEq/L (ref 19–32)
Chloride: 102 mEq/L (ref 96–112)
Creatinine, Ser: 0.75 mg/dL (ref 0.50–1.35)
GFR calc non Af Amer: >60 mL/min (ref >60)
Total Bilirubin: 0.1 mg/dL — ABNORMAL LOW (ref 0.3–1.2)

## 2010-10-13 LAB — CBC
HCT: 32 % — ABNORMAL LOW (ref 39.0–52.0)
Hemoglobin: 10.1 g/dL — ABNORMAL LOW (ref 13.0–17.0)
MCH: 25.6 pg — ABNORMAL LOW (ref 26.0–34.0)
MCHC: 31.6 g/dL (ref 30.0–36.0)
MCV: 81.2 fL (ref 78.0–100.0)

## 2010-10-13 LAB — SURGICAL PCR SCREEN: MRSA, PCR: NEGATIVE

## 2010-10-14 LAB — CBC
MCH: 25.5 pg — ABNORMAL LOW (ref 26.0–34.0)
MCHC: 30.7 g/dL (ref 30.0–36.0)
MCV: 83.2 fL (ref 78.0–100.0)
Platelets: 306 10*3/uL (ref 150–400)
RBC: 3.33 MIL/uL — ABNORMAL LOW (ref 4.22–5.81)

## 2010-10-14 LAB — BASIC METABOLIC PANEL
CO2: 27 mEq/L (ref 19–32)
Calcium: 7.8 mg/dL — ABNORMAL LOW (ref 8.4–10.5)
Creatinine, Ser: 0.67 mg/dL (ref 0.50–1.35)
GFR calc non Af Amer: >60 mL/min (ref >60)

## 2010-10-16 LAB — TYPE AND SCREEN
ABO/RH(D): B NEG
Antibody Screen: POSITIVE
DAT, IgG: NEGATIVE
Donor AG Type: NEGATIVE
Donor AG Type: NEGATIVE
Unit division: 0
Unit division: 0

## 2010-10-19 NOTE — Op Note (Signed)
[name], [name] NO.:  [id number]  MEDICAL RECORD NO.:  [id number]  LOCATION:  5029                         FACILITY:  [facility]  PHYSICIAN:  Eulas Post, MD    DATE OF BIRTH:  10-10-1960  DATE OF PROCEDURE:  10/13/2010 DATE OF DISCHARGE:                              OPERATIVE REPORT   FIRST ASSISTANT:  Janace Litten, Orthopedic PA-C, present and scrubbed throughout the case and critical for assistance with retraction, exposure, reduction, instrumentation, and closure.  PREOPERATIVE DIAGNOSIS:  Right comminuted intra-articular calcaneus fracture.  POSTOPERATIVE DIAGNOSIS:  Right comminuted intra-articular calcaneus fracture.  OPERATIVE PROCEDURE:  Open reduction and internal fixation of the right calcaneus fracture.  OPERATIVE IMPLANTS:  A Synthes locking calcaneal plate with multiple locking and a single nonlocking screw.  PREOPERATIVE INDICATIONS:  [name] is a 50 year old gentleman who has multiple orthopedic issues.  Most recently, he fell on steps and broke his calcaneus.  He elected for surgical management.  The risks, benefits, and alternatives were discussed before the procedure including not limited to risks of infection, bleeding, nerve injury, malunion, nonunion, post-traumatic arthritis, reflex sympathetic dystrophy, stiffness, loss of function, chronic pain, cardiopulmonary complications, among others, and he is willing to proceed.  He is a smoker and was counseled to quit.  OPERATIVE PROCEDURE:  The patient was brought to the operating room and placed in supine position.  General anesthesia was administered.  IV antibiotics were given.  He was turned in the lateral decubitus position.  All bony prominences were padded.  A Foley was also placed. The right lower extremity was prepped and draped in the usual sterile fashion.  The leg was elevated, exsanguinated and the tourniquet was inflated.  Total tourniquet time was 2  hours.  After time-out was performed, a lateral incision was made and a full-thickness flap was elevated.  The sural nerve was protected.  The flap was held in place with K-wires.  I exposed the calcaneus fracture and I removed the lateral wall with an osteotome.  The displaced articular segment of the subtalar joint was reduced anatomically and held with a K-wire.  I used a Schanz pin into the calcaneus tuber to reduce the tuber into the appropriate position correcting the coronal malalignment as well as the sagittal plane malalignment.  I used a K-wire to help maintain this position while I packed bone graft underneath the segmented portion of the sub Taylor joint.  I used allograft cancellous chips.  I then replaced the lateral wall, and cut and contoured a Synthes calcaneal locking plate.  I did my best to correct the coronal alignment and also Bohler angle and then secured the plate to the bone to the sustentaculum tali with a nonlocking screw.  This provided compression and held the plate in the appropriate position.  I then secured the plate both proximally and distally with locking screws.  Excellent overall fixation was achieved.  I then took final C-arm pictures including Harris heel view as well as Broden view and repaired the tissue with Vicryl followed by nylon for the skin.  Sterile gauze followed by posterior splint was applied.  He had also received a peroneal  block. The tourniquet was released and he returned to PACU in stable and satisfactory condition.  There no complications and he tolerated the procedure well.     Eulas Post, MD     JPL/MEDQ  D:  10/13/2010  T:  10/14/2010  Job:  [id number]  Electronically Signed by Teryl Lucy MD on 10/19/2010 10:38:29 AM

## 2010-10-30 ENCOUNTER — Inpatient Hospital Stay (HOSPITAL_COMMUNITY): Payer: Medicaid Other

## 2010-10-30 ENCOUNTER — Inpatient Hospital Stay (HOSPITAL_COMMUNITY)
Admission: AD | Admit: 2010-10-30 | Discharge: 2010-11-10 | DRG: 496 | Disposition: A | Discharge status: Home / Self Care | Payer: Medicaid Other | Source: Ambulatory Visit | Attending: Orthopedic Surgery | Admitting: Orthopedic Surgery

## 2010-10-30 DIAGNOSIS — T84498A Other mechanical complication of other internal orthopedic devices, implants and grafts, initial encounter: Secondary | ICD-10-CM | POA: Diagnosis present

## 2010-10-30 DIAGNOSIS — M869 Osteomyelitis, unspecified: Secondary | ICD-10-CM | POA: Diagnosis present

## 2010-10-30 DIAGNOSIS — Z9119 Patient's noncompliance with other medical treatment and regimen: Secondary | ICD-10-CM

## 2010-10-30 DIAGNOSIS — Y831 Surgical operation with implant of artificial internal device as the cause of abnormal reaction of the patient, or of later complication, without mention of misadventure at the time of the procedure: Secondary | ICD-10-CM | POA: Diagnosis present

## 2010-10-30 DIAGNOSIS — A4901 Methicillin susceptible Staphylococcus aureus infection, unspecified site: Secondary | ICD-10-CM | POA: Diagnosis present

## 2010-10-30 DIAGNOSIS — B964 Proteus (mirabilis) (morganii) as the cause of diseases classified elsewhere: Secondary | ICD-10-CM | POA: Diagnosis present

## 2010-10-30 DIAGNOSIS — D62 Acute posthemorrhagic anemia: Secondary | ICD-10-CM | POA: Diagnosis not present

## 2010-10-30 DIAGNOSIS — T847XXA Infection and inflammatory reaction due to other internal orthopedic prosthetic devices, implants and grafts, initial encounter: Principal | ICD-10-CM | POA: Diagnosis present

## 2010-10-30 DIAGNOSIS — Z91199 Patient's noncompliance with other medical treatment and regimen due to unspecified reason: Secondary | ICD-10-CM

## 2010-10-30 DIAGNOSIS — F1010 Alcohol abuse, uncomplicated: Secondary | ICD-10-CM | POA: Diagnosis present

## 2010-10-30 DIAGNOSIS — F17200 Nicotine dependence, unspecified, uncomplicated: Secondary | ICD-10-CM | POA: Diagnosis present

## 2010-10-31 DIAGNOSIS — M869 Osteomyelitis, unspecified: Secondary | ICD-10-CM

## 2010-10-31 LAB — BASIC METABOLIC PANEL
Calcium: 8.3 mg/dL — ABNORMAL LOW (ref 8.4–10.5)
Creatinine, Ser: 0.64 mg/dL (ref 0.50–1.35)
GFR calc Af Amer: >90 mL/min (ref >90)
Sodium: 134 mEq/L — ABNORMAL LOW (ref 135–145)

## 2010-10-31 LAB — POCT I-STAT 4, (NA,K, GLUC, HGB,HCT)
HCT: 30 % — ABNORMAL LOW (ref 39.0–52.0)
Hemoglobin: 10.2 g/dL — ABNORMAL LOW (ref 13.0–17.0)
Potassium: 4.4 mEq/L (ref 3.5–5.1)
Sodium: 137 mEq/L (ref 135–145)

## 2010-10-31 LAB — CBC
MCH: 24.6 pg — ABNORMAL LOW (ref 26.0–34.0)
MCV: 80.6 fL (ref 78.0–100.0)
Platelets: 355 10*3/uL (ref 150–400)
RDW: 17.4 % — ABNORMAL HIGH (ref 11.5–15.5)
WBC: 7.5 10*3/uL (ref 4.0–10.5)

## 2010-10-31 LAB — SEDIMENTATION RATE: Sed Rate: 45 mm/hr — ABNORMAL HIGH (ref 0–16)

## 2010-10-31 NOTE — Op Note (Signed)
[name], [name] NO.:  [id number]  MEDICAL RECORD NO.:  [id number]  LOCATION:  5039                         FACILITY:  [facility]  PHYSICIAN:  Eulas Post, MD    DATE OF BIRTH:  04/22/1960  DATE OF PROCEDURE:  10/30/2010 DATE OF DISCHARGE:                              OPERATIVE REPORT   ATTENDING SURGEON:  Eulas Post, MD  ASSISTANT:  Janace Litten, Orthopedic PA-C, present and scrubbed throughout the case assisting with exposure, retraction, and application of the wound VAC.  PREOPERATIVE DIAGNOSIS:  Infected right calcaneus fracture with prominent hardware.  POSTOPERATIVE DIAGNOSIS:  Infected right calcaneus fracture with prominent hardware.  OPERATIVE PROCEDURE:  I and D right, skin, subcutaneous tissue, bone, plate, with removal of a prominent screw, and application of wound VAC.  PREOPERATIVE INDICATIONS:  [name] is a 50 year old gentleman who has substantial history of musculoskeletal problems including a left shoulder hemiarthroplasty that had a fracture, dislocation and was converted to a long stem and had a postoperative infection that underwent multiple I and D's with methicillin sensitive Staph aureus and PICC line and IV antibiotics.  During the early recovery from that series of problems, he fell down the stairs and had a calcaneus fracture.  He underwent surgical management.  He unfortunately has continued to smoke, and as it turns out, got his splint wet, but did not seek any medical help, and walked with weightbearing even despite my instructions, on a wet splint for period of almost a week.  He then came back today for his routine followup, and was found to have purulence and drainage coming out from underneath his flap from his calcaneus wound. He was then brought to the hospital for urgent I and D.  The risks benefits and alternatives were discussed with him before the procedure including, but not limited to risks  of infection, osteomyelitis, bleeding, nerve injury, loss of limb, malunion, nonunion, chronic problems with his fracture, cardiopulmonary complications, among others and he was willing to proceed.  His overall prognosis is relatively poor.  OPERATIVE PROCEDURE:  The patient was brought to the operating room, placed in supine position.  IV antibiotics were held until after cultures were taken.  After general anesthesia was induced, he was put into the semilateral decubitus position.  Time-out was performed.  He also had a regional block.  The right lower extremity was prepped and draped in usual sterile fashion.  Incision was made through the previous incision and all sutures were removed.  There was purulence that tracked down to the level of the plate and the bone.  This was excised, and all loose necrotic debris was removed.  I used a curette as well as a rongeur.  There was a prominent screw, the single nonlocking screw had backed its way out already.  This was due to the fact that he had been weightbearing on this fracture.  After debridement and curettage of the bone as well as the tissue, I irrigated 3 L of fluid using pulse lavage. I then used a scrub brush with chlorhexidine and scrubbed the plate and fracture site.  I then irrigated another 6 L of fluid  through the wound. I had a clean bed of tissue.  I placed a Mepitel layer and then the sponge for the wound VAC, and then applied a wound VAC.  Excellent seal was achieved.  A removable posterior splint was then applied.  He was then awakened and returned to the PACU in stable and satisfactory condition.  There were no complications.  He tolerated the procedure well.  We will need to do at least 1 more surgical I and D prior to definitive closure.  He will also need a PICC line with IV antibiotics for a period of probably at least 6- 8 weeks.  I have already requested consultation with Infectious Disease and spoke with Dr.  Ninetta Lights today.     Eulas Post, MD     JPL/MEDQ  D:  10/30/2010  T:  10/31/2010  Job:  [id number]  Electronically Signed by Teryl Lucy MD on 10/31/2010 09:47:21 AM

## 2010-11-01 LAB — COMPREHENSIVE METABOLIC PANEL
Alkaline Phosphatase: 73 U/L (ref 39–117)
BUN: 10 mg/dL (ref 6–23)
GFR calc Af Amer: >90 mL/min (ref >90)
GFR calc non Af Amer: >90 mL/min (ref >90)
Glucose, Bld: 101 mg/dL — ABNORMAL HIGH (ref 70–99)
Potassium: 4.5 mEq/L (ref 3.5–5.1)
Total Bilirubin: 0.3 mg/dL (ref 0.3–1.2)
Total Protein: 6.2 g/dL (ref 6.0–8.3)

## 2010-11-01 LAB — URINALYSIS, ROUTINE W REFLEX MICROSCOPIC
Bilirubin Urine: NEGATIVE
Hgb urine dipstick: NEGATIVE
Specific Gravity, Urine: 1.009 (ref 1.005–1.030)
Urobilinogen, UA: 0.2 mg/dL (ref 0.0–1.0)

## 2010-11-01 LAB — POCT I-STAT 4, (NA,K, GLUC, HGB,HCT): Glucose, Bld: 92 mg/dL (ref 70–99)

## 2010-11-02 DIAGNOSIS — M869 Osteomyelitis, unspecified: Secondary | ICD-10-CM

## 2010-11-02 LAB — CBC
Platelets: 362 10*3/uL (ref 150–400)
RBC: 3.42 MIL/uL — ABNORMAL LOW (ref 4.22–5.81)
WBC: 6.4 10*3/uL (ref 4.0–10.5)

## 2010-11-02 LAB — DIFFERENTIAL
Basophils Absolute: 0.1 10*3/uL (ref 0.0–0.1)
Basophils Relative: 2 % — ABNORMAL HIGH (ref 0–1)
Eosinophils Absolute: 0.5 10*3/uL (ref 0.0–0.7)
Lymphs Abs: 1.4 10*3/uL (ref 0.7–4.0)
Neutrophils Relative %: 58 % (ref 43–77)

## 2010-11-04 LAB — ANAEROBIC CULTURE

## 2010-11-04 LAB — TISSUE CULTURE

## 2010-11-05 LAB — WOUND CULTURE: Gram Stain: NONE SEEN

## 2010-11-06 NOTE — Op Note (Signed)
  [name], [name]:  [id number]  MEDICAL RECORD NO.:  [id number]  LOCATION:  5039                         FACILITY:  [facility]  PHYSICIAN:  Eulas Post, MD    DATE OF BIRTH:  1960-10-22  DATE OF PROCEDURE:  11/03/2010 DATE OF DISCHARGE:                              OPERATIVE REPORT   ATTENDING SURGEON:  Eulas Post, MD  PREOPERATIVE DIAGNOSIS:  Right infected calcaneus fracture.  POSTOPERATIVE DIAGNOSIS:  Right infected calcaneus fracture.  OPERATIVE PROCEDURE:  Incision and drainage, right infected calcaneus fracture with wound VAC change.  ANESTHESIA:  General.  ESTIMATED BLOOD LOSS:  Minimal.  PREOPERATIVE INDICATIONS:  [name] is a 50 year old gentleman who has an infected calcaneus.  He elected for revision I and D with wound VAC change.  The risks, benefits, and alternatives were discussed.  He elected to proceed.  OPERATIVE PROCEDURE:  The patient was brought to the operating room and placed in supine position.  General anesthesia was administered.  The right lower extremity was prepped and draped in the usual sterile fashion.  Time-out was performed.  The wound VAC had previously been removed prior to prepping, and I debrided the wound.  The wound itself appeared very clean.  I did not find any extra evidence for purulence. After complete debridement using a curette and a rongeur and a gauze sponge, the wound was irrigated with 6 L of fluid.  A small wound VAC sponge was then contoured and applied with Mepitel placed between the tissue and the wound.  A wound VAC was applied and excellent seal achieved.  The patient was then placed into a removable posterior splint.  He was awakened and returned to PACU in stable and satisfactory condition.  There were no complications.  He tolerated the procedure well.  He will plan to be in the hospital until definitive closure which I am planning on doing next Tuesday if his wound continues  to be clean. He will also be on IV antibiotics as directed by the Infectious Disease service.     Eulas Post, MD     JPL/MEDQ  D:  11/03/2010  T:  11/04/2010  Job:  [id number]  Electronically Signed by Teryl Lucy MD on 11/06/2010 04:22:53 PM

## 2010-11-07 LAB — COMPREHENSIVE METABOLIC PANEL
ALT: 5 U/L (ref 0–53)
AST: 12 U/L (ref 0–37)
Albumin: 3.1 g/dL — ABNORMAL LOW (ref 3.5–5.2)
Alkaline Phosphatase: 80 U/L (ref 39–117)
BUN: 17 mg/dL (ref 6–23)
Chloride: 97 mEq/L (ref 96–112)
Potassium: 4.2 mEq/L (ref 3.5–5.1)
Sodium: 134 mEq/L — ABNORMAL LOW (ref 135–145)
Total Bilirubin: 0.3 mg/dL (ref 0.3–1.2)
Total Protein: 6.9 g/dL (ref 6.0–8.3)

## 2010-11-07 LAB — DIFFERENTIAL
Basophils Absolute: 0.2 10*3/uL — ABNORMAL HIGH (ref 0.0–0.1)
Eosinophils Relative: 9 % — ABNORMAL HIGH (ref 0–5)
Monocytes Relative: 11 % (ref 3–12)
Neutrophils Relative %: 47 % (ref 43–77)

## 2010-11-07 LAB — CBC
HCT: 28.4 % — ABNORMAL LOW (ref 39.0–52.0)
Hemoglobin: 8.9 g/dL — ABNORMAL LOW (ref 13.0–17.0)
RDW: 16.8 % — ABNORMAL HIGH (ref 11.5–15.5)
WBC: 6.1 10*3/uL (ref 4.0–10.5)

## 2010-11-08 LAB — CBC
HCT: 26.7 % — ABNORMAL LOW (ref 39.0–52.0)
MCH: 24.3 pg — ABNORMAL LOW (ref 26.0–34.0)
MCHC: 31.1 g/dL (ref 30.0–36.0)
MCV: 78.3 fL (ref 78.0–100.0)
Platelets: 214 10*3/uL (ref 150–400)
RDW: 16.7 % — ABNORMAL HIGH (ref 11.5–15.5)

## 2010-11-08 LAB — BASIC METABOLIC PANEL
BUN: 12 mg/dL (ref 6–23)
Calcium: 9.4 mg/dL (ref 8.4–10.5)
Chloride: 99 mEq/L (ref 96–112)
Creatinine, Ser: 0.69 mg/dL (ref 0.50–1.35)
GFR calc Af Amer: >90 mL/min (ref >90)
GFR calc non Af Amer: >90 mL/min (ref >90)

## 2010-11-09 LAB — BASIC METABOLIC PANEL
BUN: 15 mg/dL (ref 6–23)
GFR calc Af Amer: >90 mL/min (ref >90)
GFR calc non Af Amer: >90 mL/min (ref >90)
Potassium: 4.4 mEq/L (ref 3.5–5.1)

## 2010-11-09 LAB — CBC
HCT: 27 % — ABNORMAL LOW (ref 39.0–52.0)
MCHC: 31.5 g/dL (ref 30.0–36.0)
Platelets: 254 10*3/uL (ref 150–400)
RDW: 16.8 % — ABNORMAL HIGH (ref 11.5–15.5)
WBC: 6 10*3/uL (ref 4.0–10.5)

## 2010-11-15 NOTE — Discharge Summary (Signed)
  [name], [name] NO.:  [id number]  MEDICAL RECORD NO.:  [id number]  LOCATION:  5039                         FACILITY:  [facility]  PHYSICIAN:  Eulas Post, MD    DATE OF BIRTH:  [birth year]  DATE OF ADMISSION:  10/30/2010 DATE OF DISCHARGE:  11/09/2010                              DISCHARGE SUMMARY   ADMISSION DIAGNOSIS:  Infected right calcaneus fracture.  DISCHARGE DIAGNOSES:  Infected right calcaneus fracture, methicillin sensitive Staph aureus and Proteus mirabilis infection of the calcaneus that was pan sensitive.  ADDITIONAL DIAGNOSES:  Malnutrition, alcohol abuse, and tobacco abuse, and recent infected left shoulder hemiarthroplasty.  HOSPITAL COURSE:  [name] is a 50 year old gentleman who broke his calcaneus.  He had had a recent previous shoulder infected hemiarthroplasty.  He presented and I performed an open reduction and internal fixation and 2 weeks later it appeared that he had been walking on it and had not been caring for the wound and in fact had gotten a splint wet about a week and a half prior to presentation and the splint was wet and ultimately ended up with an infected calcaneal wound.  He was admitted to the hospital and went to the operating room for I and D on September 29 and a wound VAC was placed.  We repeated the I and D on October 16 and then finally repeated again on October 20 and removed the wound VAC and closed his wound.  His wounds were then cleaned without evidence of purulence or drainage.  Infectious disease consultation was also obtained and they assisted with the management of his IV antibiotics.  A PICC line was placed.  He is being discharged home on rifampin as well as ertapenem.  Planning duration until November 30.  He is nonweightbearing on his right lower extremity, and I have also discharged him with Percocet as well as Dilaudid.  He benefited from his hospital stay and there were no  complications during this admission, although this was a readmission secondary to a previous surgical intervention which was complicated by postoperative infection.  He will be discharged home with followup with me in 1 week.     Eulas Post, MD     JPL/MEDQ  D:  11/10/2010  T:  11/10/2010  Job:  [id number]  Electronically Signed by Teryl Lucy MD on 11/15/2010 09:20:19 AM

## 2010-11-15 NOTE — Op Note (Signed)
  NAMEJARMARCUS, [name] NO.:  [id number]  MEDICAL RECORD NO.:  [id number]  LOCATION:  5039                         FACILITY:  [facility]  PHYSICIAN:  Eulas Post, MD    DATE OF BIRTH:  Jan 18, 1960  DATE OF PROCEDURE:  11/07/2010 DATE OF DISCHARGE:  11/10/2010                              OPERATIVE REPORT   ATTENDING SURGEON:  Eulas Post, MD.  FIRST ASSISTANT:  Janace Litten, OPA.  PREOPERATIVE DIAGNOSIS:  Right calcaneus infection status post incision and drainage.  POSTOPERATIVE DIAGNOSIS:  Right calcaneus infection status post incision and drainage.  OPERATIVE PROCEDURE:  Right calcaneus I and D with delayed primary closure.  PREOPERATIVE INDICATIONS:  Please see his previous chart.  He elected for primary closure after multiple repeat I and D.  The risks, benefits, and alternatives were discussed including but not limited to risks of recurrent infection, osteomyelitis, progression of infection, the need for further surgical intervention, the potential for amputation, the need for removal of hardware potentially, the cardiopulmonary complications, among others and he is willing to proceed.  OPERATIVE PROCEDURE:  The patient was brought to the operating room, placed in supine position.  He was already on IV antibiotics.  He was turned into the lateral decubitus position and the right lower extremity is prepped and draped in usual sterile fashion.  General anesthesia had been administered.  Time-out was performed.  The wound VAC was removed. I debrided the wound and it appeared very clean.  I pulse lavage the wound as well as in order to assist with further cleansing.  I then repaired the skin with nylon suture.  Posterior splint was applied. This was done along with sterile dressing.  The patient was awakened and returned to PACU in stable and satisfactory condition.  There are no complications.  He tolerated the procedure well.  He will continue  on IV antibiotics as per the instructions per Infectious Disease.     Eulas Post, MD     JPL/MEDQ  D:  11/15/2010  T:  11/15/2010  Job:  [id number]  Electronically Signed by Teryl Lucy MD on 11/15/2010 05:31:06 PM

## 2011-01-02 ENCOUNTER — Inpatient Hospital Stay (HOSPITAL_COMMUNITY)
Admission: RE | Admit: 2011-01-02 | Discharge: 2011-01-05 | DRG: 908 | Disposition: A | Discharge status: Home / Self Care | Payer: Medicaid Other | Source: Ambulatory Visit | Attending: Orthopedic Surgery | Admitting: Orthopedic Surgery

## 2011-01-02 ENCOUNTER — Encounter (HOSPITAL_COMMUNITY): Payer: Self-pay | Admitting: Anesthesiology

## 2011-01-02 ENCOUNTER — Encounter (HOSPITAL_COMMUNITY): Payer: Self-pay | Admitting: *Deleted

## 2011-01-02 ENCOUNTER — Inpatient Hospital Stay (HOSPITAL_COMMUNITY): Payer: Medicaid Other | Admitting: Anesthesiology

## 2011-01-02 ENCOUNTER — Encounter (HOSPITAL_COMMUNITY)
Admission: RE | Disposition: A | Discharge status: Home / Self Care | Payer: Self-pay | Source: Ambulatory Visit | Attending: Orthopedic Surgery

## 2011-01-02 ENCOUNTER — Inpatient Hospital Stay (HOSPITAL_COMMUNITY): Payer: Medicaid Other

## 2011-01-02 DIAGNOSIS — Y831 Surgical operation with implant of artificial internal device as the cause of abnormal reaction of the patient, or of later complication, without mention of misadventure at the time of the procedure: Secondary | ICD-10-CM | POA: Diagnosis present

## 2011-01-02 DIAGNOSIS — K219 Gastro-esophageal reflux disease without esophagitis: Secondary | ICD-10-CM | POA: Diagnosis present

## 2011-01-02 DIAGNOSIS — A4901 Methicillin susceptible Staphylococcus aureus infection, unspecified site: Secondary | ICD-10-CM | POA: Diagnosis present

## 2011-01-02 DIAGNOSIS — F411 Generalized anxiety disorder: Secondary | ICD-10-CM | POA: Diagnosis present

## 2011-01-02 DIAGNOSIS — F3289 Other specified depressive episodes: Secondary | ICD-10-CM | POA: Diagnosis present

## 2011-01-02 DIAGNOSIS — Z96619 Presence of unspecified artificial shoulder joint: Secondary | ICD-10-CM

## 2011-01-02 DIAGNOSIS — F329 Major depressive disorder, single episode, unspecified: Secondary | ICD-10-CM | POA: Diagnosis present

## 2011-01-02 DIAGNOSIS — Z8711 Personal history of peptic ulcer disease: Secondary | ICD-10-CM

## 2011-01-02 DIAGNOSIS — T8189XA Other complications of procedures, not elsewhere classified, initial encounter: Principal | ICD-10-CM | POA: Diagnosis present

## 2011-01-02 DIAGNOSIS — S92001A Unspecified fracture of right calcaneus, initial encounter for closed fracture: Secondary | ICD-10-CM | POA: Diagnosis present

## 2011-01-02 DIAGNOSIS — T8450XA Infection and inflammatory reaction due to unspecified internal joint prosthesis, initial encounter: Secondary | ICD-10-CM | POA: Diagnosis present

## 2011-01-02 DIAGNOSIS — M869 Osteomyelitis, unspecified: Secondary | ICD-10-CM | POA: Diagnosis present

## 2011-01-02 HISTORY — DX: Insomnia, unspecified: G47.00

## 2011-01-02 HISTORY — DX: Disorientation, unspecified: R41.0

## 2011-01-02 HISTORY — DX: Dizziness and giddiness: R42

## 2011-01-02 HISTORY — DX: Gastro-esophageal reflux disease without esophagitis: K21.9

## 2011-01-02 HISTORY — DX: Pneumonia, unspecified organism: J18.9

## 2011-01-02 HISTORY — DX: Anxiety disorder, unspecified: F41.9

## 2011-01-02 HISTORY — DX: Unspecified fracture of right calcaneus, initial encounter for closed fracture: S92.001A

## 2011-01-02 HISTORY — PX: I & D EXTREMITY: SHX5045

## 2011-01-02 HISTORY — DX: Calculus of kidney: N20.0

## 2011-01-02 HISTORY — DX: Other injury of unspecified body region, initial encounter: T14.8XXA

## 2011-01-02 HISTORY — DX: Osteomyelitis, unspecified: M86.9

## 2011-01-02 HISTORY — DX: Headache: R51

## 2011-01-02 LAB — CBC
HCT: 33.1 % — ABNORMAL LOW (ref 39.0–52.0)
Hemoglobin: 9.5 g/dL — ABNORMAL LOW (ref 13.0–17.0)
MCH: 22.6 pg — ABNORMAL LOW (ref 26.0–34.0)
MCHC: 28.7 g/dL — ABNORMAL LOW (ref 30.0–36.0)
MCV: 78.8 fL (ref 78.0–100.0)
Platelets: 580 10*3/uL — ABNORMAL HIGH (ref 150–400)
RBC: 4.2 MIL/uL — ABNORMAL LOW (ref 4.22–5.81)
RDW: 18.2 % — ABNORMAL HIGH (ref 11.5–15.5)
WBC: 12.3 10*3/uL — ABNORMAL HIGH (ref 4.0–10.5)

## 2011-01-02 LAB — BASIC METABOLIC PANEL
BUN: 22 mg/dL (ref 6–23)
CO2: 21 mEq/L (ref 19–32)
Calcium: 9.4 mg/dL (ref 8.4–10.5)
Chloride: 99 mEq/L (ref 96–112)
Creatinine, Ser: 1.13 mg/dL (ref 0.50–1.35)
GFR calc Af Amer: 86 mL/min — ABNORMAL LOW (ref >90)
GFR calc non Af Amer: 74 mL/min — ABNORMAL LOW (ref >90)
Glucose, Bld: 101 mg/dL — ABNORMAL HIGH (ref 70–99)
Potassium: 3.8 mEq/L (ref 3.5–5.1)
Sodium: 134 mEq/L — ABNORMAL LOW (ref 135–145)

## 2011-01-02 LAB — SURGICAL PCR SCREEN
MRSA, PCR: NEGATIVE
Staphylococcus aureus: POSITIVE — AB

## 2011-01-02 LAB — SEDIMENTATION RATE: Sed Rate: 59 mm/hr — ABNORMAL HIGH (ref 0–16)

## 2011-01-02 SURGERY — IRRIGATION AND DEBRIDEMENT EXTREMITY
Anesthesia: General | Site: Foot | Laterality: Right | Wound class: Dirty or Infected

## 2011-01-02 MED ORDER — MIDAZOLAM HCL 5 MG/5ML IJ SOLN
INTRAMUSCULAR | Status: DC | PRN
  Administered 2011-01-02: 2 mg via INTRAVENOUS

## 2011-01-02 MED ORDER — BUPIVACAINE-EPINEPHRINE PF 0.5-1:200000 % IJ SOLN
INTRAMUSCULAR | Status: DC | PRN
  Administered 2011-01-02: 150 mg

## 2011-01-02 MED ORDER — MUPIROCIN 2 % EX OINT
TOPICAL_OINTMENT | CUTANEOUS | Status: AC
  Administered 2011-01-02: 1 via NASAL
  Filled 2011-01-02: qty 22

## 2011-01-02 MED ORDER — SODIUM CHLORIDE 0.9 % IR SOLN
Status: DC | PRN
  Administered 2011-01-02: 6000 mL

## 2011-01-02 MED ORDER — CEFAZOLIN SODIUM 1-5 GM-% IV SOLN
INTRAVENOUS | Status: DC | PRN
  Administered 2011-01-02: 1 g via INTRAVENOUS

## 2011-01-02 MED ORDER — PROPOFOL 10 MG/ML IV BOLUS
INTRAVENOUS | Status: DC | PRN
  Administered 2011-01-02: 30 mg via INTRAVENOUS
  Administered 2011-01-02: 150 mg via INTRAVENOUS
  Administered 2011-01-02: 20 mg via INTRAVENOUS

## 2011-01-02 MED ORDER — DEXTROSE 5 % IV SOLN
10.0000 mg | INTRAVENOUS | Status: DC | PRN
  Administered 2011-01-02: 80 ug/min via INTRAVENOUS

## 2011-01-02 MED ORDER — SODIUM CHLORIDE 0.9 % IV SOLN
INTRAVENOUS | Status: DC | PRN
  Administered 2011-01-02: 23:00:00 via INTRAVENOUS

## 2011-01-02 MED ORDER — CEFAZOLIN SODIUM 1-5 GM-% IV SOLN: 1.0000 g | INTRAVENOUS | Status: DC

## 2011-01-02 MED ORDER — CHLORHEXIDINE GLUCONATE 4 % EX LIQD: 60.0000 mL | Freq: Once | CUTANEOUS | Status: DC

## 2011-01-02 MED ORDER — LACTATED RINGERS IV SOLN
INTRAVENOUS | Status: DC | PRN
  Administered 2011-01-02: 22:00:00 via INTRAVENOUS

## 2011-01-02 MED ORDER — SODIUM CHLORIDE 0.9 % IR SOLN
Status: DC | PRN
  Administered 2011-01-02: 1000 mL

## 2011-01-02 MED ORDER — FENTANYL CITRATE 0.05 MG/ML IJ SOLN
INTRAMUSCULAR | Status: DC | PRN
  Administered 2011-01-02 (×3): 50 ug via INTRAVENOUS

## 2011-01-02 SURGICAL SUPPLY — 40 items
BANDAGE ELASTIC 4 VELCRO ST LF (GAUZE/BANDAGES/DRESSINGS) ×1 IMPLANT
BANDAGE ELASTIC 6 VELCRO ST LF (GAUZE/BANDAGES/DRESSINGS) ×1 IMPLANT
BANDAGE GAUZE ELAST BULKY 4 IN (GAUZE/BANDAGES/DRESSINGS) ×1 IMPLANT
BNDG COHESIVE 4X5 TAN STRL (GAUZE/BANDAGES/DRESSINGS) ×1 IMPLANT
BOOTCOVER CLEANROOM LRG (PROTECTIVE WEAR) ×4 IMPLANT
BOTTLE VACUUM COLLECT 2000ML (MISCELLANEOUS) ×1 IMPLANT
CLOTH BEACON ORANGE TIMEOUT ST (SAFETY) ×2 IMPLANT
COVER SURGICAL LIGHT HANDLE (MISCELLANEOUS) ×2 IMPLANT
CUFF TOURNIQUET SINGLE 34IN LL (TOURNIQUET CUFF) IMPLANT
DRAPE INCISE IOBAN 66X45 STRL (DRAPES) ×1 IMPLANT
DRSG VAC ATS MED SENSATRAC (GAUZE/BANDAGES/DRESSINGS) ×2 IMPLANT
DURAPREP 26ML APPLICATOR (WOUND CARE) ×1 IMPLANT
ELECT REM PT RETURN 9FT ADLT (ELECTROSURGICAL) ×2
ELECTRODE REM PT RTRN 9FT ADLT (ELECTROSURGICAL) IMPLANT
EVACUATOR 1/8 PVC DRAIN (DRAIN) IMPLANT
GAUZE XEROFORM 1X8 LF (GAUZE/BANDAGES/DRESSINGS) ×1 IMPLANT
GLOVE BIOGEL PI IND STRL 8 (GLOVE) ×1 IMPLANT
GLOVE BIOGEL PI INDICATOR 8 (GLOVE) ×3
GLOVE ORTHO TXT STRL SZ7.5 (GLOVE) ×2 IMPLANT
GLOVE SURG ORTHO 8.0 STRL STRW (GLOVE) ×3 IMPLANT
GOWN STRL NON-REIN LRG LVL3 (GOWN DISPOSABLE) IMPLANT
HANDPIECE INTERPULSE COAX TIP (DISPOSABLE) ×2
KIT BASIN OR (CUSTOM PROCEDURE TRAY) ×2 IMPLANT
KIT ROOM TURNOVER OR (KITS) ×2 IMPLANT
MANIFOLD NEPTUNE II (INSTRUMENTS) ×2 IMPLANT
NS IRRIG 1000ML POUR BTL (IV SOLUTION) ×2 IMPLANT
PACK ORTHO EXTREMITY (CUSTOM PROCEDURE TRAY) ×2 IMPLANT
PAD ARMBOARD 7.5X6 YLW CONV (MISCELLANEOUS) ×4 IMPLANT
SET HNDPC FAN SPRY TIP SCT (DISPOSABLE) IMPLANT
SPONGE GAUZE 4X4 12PLY (GAUZE/BANDAGES/DRESSINGS) ×1 IMPLANT
SPONGE LAP 18X18 X RAY DECT (DISPOSABLE) ×2 IMPLANT
STOCKINETTE IMPERVIOUS 9X36 MD (GAUZE/BANDAGES/DRESSINGS) ×3 IMPLANT
SUT ETHILON 3 0 PS 1 (SUTURE) IMPLANT
TOWEL OR 17X24 6PK STRL BLUE (TOWEL DISPOSABLE) ×2 IMPLANT
TOWEL OR 17X26 10 PK STRL BLUE (TOWEL DISPOSABLE) ×2 IMPLANT
TUBE ANAEROBIC SPECIMEN COL (MISCELLANEOUS) ×1 IMPLANT
TUBE CONNECTING 12X1/4 (SUCTIONS) ×2 IMPLANT
UNDERPAD 30X30 INCONTINENT (UNDERPADS AND DIAPERS) ×2 IMPLANT
WATER STERILE IRR 1000ML POUR (IV SOLUTION) ×1 IMPLANT
YANKAUER SUCT BULB TIP NO VENT (SUCTIONS) ×2 IMPLANT

## 2011-01-02 NOTE — Progress Notes (Signed)
Never had a stress test/echo/heart cath

## 2011-01-02 NOTE — Anesthesia Preprocedure Evaluation (Signed)
Anesthesia Evaluation  Patient identified by MRN, date of birth, ID band Patient awake    Reviewed: Allergy & Precautions, H&P , NPO status , Patient's Chart, lab work & pertinent test results, reviewed documented beta blocker date and time   Airway Mallampati: II TM Distance: >3 FB Neck ROM: Full    Dental  (+) Dental Advisory Given   Pulmonary pneumonia , Current Smoker,  clear to auscultation        Cardiovascular Regular Normal- Systolic murmurs    Neuro/Psych  Headaches, PSYCHIATRIC DISORDERS Anxiety Depression    GI/Hepatic Neg liver ROS, PUD, GERD-  Controlled and Medicated,  Endo/Other  Negative Endocrine ROS  Renal/GU Renal diseasenegative Renal ROS     Musculoskeletal   Abdominal   Peds  Hematology   Anesthesia Other Findings   Reproductive/Obstetrics                           Anesthesia Physical Anesthesia Plan  ASA: II  Anesthesia Plan: General   Post-op Pain Management:    Induction: Intravenous  Airway Management Planned: LMA  Additional Equipment:   Intra-op Plan:   Post-operative Plan:   Informed Consent:   Dental advisory given  Plan Discussed with: CRNA and Anesthesiologist  Anesthesia Plan Comments:         Anesthesia Quick Evaluation

## 2011-01-02 NOTE — H&P (Signed)
PREOPERATIVE H&P  Chief Complaint: Non Healing Ankle Wound  HPI: [name] is a 50 y.o. male who presents for preoperative history and physical with a diagnosis of Non Healing Ankle Wound. Symptoms are rated as moderate to severe, and have been worsening.  This is significantly impairing activities of daily living.  He has elected for surgical management. He had a calcaneus fracture ORIF, but subsequently had infection, and was washed out multiple times with a wound VAC, and had a PICC line placed. He has been off antibiotics now, for at least a couple of weeks, and has had recurrent drainage and purulence with foul-smelling coming from his heel.  He has a past history of a left proximal humerus fracture, that underwent ORIF, followed by hemiarthroplasty, and 2 years later had a periprosthetic fracture dislocation that underwent longstem ORIF with prosthetic replacement, that subsequently had infection with multiple surgical debridements. This has not been draining, but periodically does have some purulence from that proximal shoulder wound is well.  He has not drank in 4 months.  Past Medical History  Diagnosis Date  . GI bleed   . Proximal humerus fracture 07/17/2010    with dislocation, repaired 07/17/2010  . Depression   . Substance abuse   . H/O: GI bleed   . Fracture of humerus, proximal, left, open 07/17/2010    repaired 07/21/2010  . Frequent falls     sec. to alcohol abuse   Past Surgical History  Procedure Date  . Fracture surgery 07/17/2010    ORIF left proximal humerus dislocation   History   Social History  . Marital Status: Divorced    Spouse Name: N/A    Number of Children: N/A  . Years of Education: N/A   Social History Main Topics  . Smoking status: Not on file  . Smokeless tobacco: Not on file  . Alcohol Use:   . Drug Use:   . Sexually Active:    Other Topics Concern  . Not on file   Social History Narrative  . No narrative on file   No family history  on file. No Known Allergies Prior to Admission medications   Medication Sig Start Date End Date Taking? Authorizing Provider  acetaminophen (TYLENOL) 500 MG tablet Take 500 mg by mouth 4 (four) times daily as needed. For pain     Historical Provider, MD  ALPRAZolam (XANAX) 0.25 MG tablet Take 0.25 mg by mouth 2 (two) times daily.      Historical Provider, MD  ceFAZolin (ANCEF) 2-3 GM-% SOLR Inject 2 g into the vein every 8 (eight) hours.      Historical Provider, MD  citalopram (CELEXA) 40 MG tablet Take 40 mg by mouth daily.      Historical Provider, MD  ferrous gluconate (FERGON) 324 MG tablet Take 324 mg by mouth daily with breakfast.      Historical Provider, MD  folic acid (FOLVITE) 1 MG tablet Take 1 mg by mouth daily.      Historical Provider, MD  gabapentin (NEURONTIN) 100 MG capsule Take 100 mg by mouth 3 (three) times daily.      Historical Provider, MD  LORazepam (ATIVAN) 1 MG tablet Take 1 mg by mouth 3 (three) times daily as needed.      Historical Provider, MD  methocarbamol (ROBAXIN) 750 MG tablet Take 750 mg by mouth 4 (four) times daily.      Historical Provider, MD  Multiple Vitamin (MULTIVITAMIN) tablet Take 1 tablet by mouth daily.  Historical Provider, MD  pantoprazole (PROTONIX) 40 MG tablet Take 40 mg by mouth every 12 (twelve) hours.      Historical Provider, MD  rifampin (RIFADIN) 300 MG capsule Take 600 mg by mouth at bedtime.      Historical Provider, MD  Thiamine HCl (VITAMIN B-1) 100 MG tablet Take 100 mg by mouth daily.      Historical Provider, MD     Positive ROS: All other systems have been reviewed and were otherwise negative with the exception of those mentioned in the HPI and as above.  Physical Exam: General: Alert, no acute distress Cardiovascular: No pedal edema Respiratory: No cyanosis, no use of accessory musculature GI: No organomegaly, abdomen is soft and non-tender Skin: No lesions in the area of chief complaint Neurologic: Sensation intact  distally Psychiatric: Patient is competent for consent with normal mood and affect Lymphatic: No axillary or cervical lymphadenopathy  MUSCULOSKELETAL: Right heel has drainage and purulent coming from the incision. There is foul-smelling material.  Assessment: Non Healing Ankle Wound, status post calcaneus fracture with I&D, postoperative infection.  Plan: Plan for Procedure(s): IRRIGATION AND DEBRIDEMENT EXTREMITY with removal of calcaneal plate and screws.  The risks benefits and alternatives were discussed with the patient including but not limited to the risks of nonoperative treatment, versus surgical intervention including infection, bleeding, nerve injury,  blood clots, cardiopulmonary complications, morbidity, mortality, among others, and they were willing to proceed. We discussed the risks of the potential for amputation, as well as ongoing infection, osteomyelitis, the need for further surgical intervention, among others, and he is willing to proceed.  Tor Tsuda P 01/02/2011 10:50 AM

## 2011-01-02 NOTE — Progress Notes (Signed)
REPORT CALLED TO SHARON RN FOR [phone] AND TRANSFERRED VIA W/C

## 2011-01-02 NOTE — Anesthesia Procedure Notes (Addendum)
Anesthesia Regional Block:  Popliteal block  Pre-Anesthetic Checklist: ,, timeout performed, Correct Patient, Correct Site, Correct Laterality, Correct Procedure,, site marked, risks and benefits discussed, Surgical consent,  Pre-op evaluation,  At surgeon's request and post-op pain management   Prep: chloraprep       Needles:  Injection technique: Single-shot  Needle Type: Echogenic Stimulator Needle          Additional Needles:  Procedures: ultrasound guided and nerve stimulator Popliteal block  Nerve Stimulator or Paresthesia:  Response: plantar flexion, 0.45 mA,   Additional Responses:   Narrative:  Start time: 01/02/2011 9:51 PM End time: 01/02/2011 10:01 PM  Performed by: Personally  Anesthesiologist: J. Adonis Huguenin, MD  Additional Notes: A functioning IV was confirmed and monitors were applied.  Sterile prep and drape, hand hygiene and sterile gloves were used.  Negative aspiration and test dose prior to incremental administration of local anesthetic. The patient tolerated the procedure well. Ultrasound guidance: relevent anatomy identified, needle position confirmed, local anesthetic spread visualized around nerve(s), vascular puncture avoided.  Image printed for medical record.   Popliteal block Procedure Name: LMA Insertion Date/Time: 01/02/2011 10:10 PM Performed by: Alanda Amass Pre-anesthesia Checklist: Patient identified, Timeout performed, Emergency Drugs available, Suction available and Patient being monitored Patient Re-evaluated:Patient Re-evaluated prior to inductionOxygen Delivery Method: Circle System Utilized Preoxygenation: Pre-oxygenation with 100% oxygen Intubation Type: IV induction Ventilation: Mask ventilation without difficulty LMA: LMA inserted LMA Size: 4.0 Number of attempts: 1 Placement Confirmation: positive ETCO2 and breath sounds checked- equal and bilateral Tube secured with: Tape Dental Injury: Teeth and Oropharynx as per  pre-operative assessment

## 2011-01-02 NOTE — Op Note (Signed)
01/02/2011  11:10 PM  PATIENT:  [name]  50 y.o. male  PRE-OPERATIVE DIAGNOSIS:  Infected  right calcaneus fracture  POST-OPERATIVE DIAGNOSIS:  Same  PROCEDURE:  IRRIGATION AND DEBRIDEMENT EXTREMITY with removal of hardware, deep, debridement of skin, subcutaneous tissue, tendon, and bone. Placement of small wound VAC.  SURGEON:  Benton Tooker P  PHYSICIAN ASSISTANT: Janace Litten, OPA-C  ANESTHESIA:   General  Operative procedure: The patient is brought to the operating room and placed in the supine position. Preoperative antibiotics were held prior to cultures being taken. General anesthesia was administered. Regional block had also been given. The right lower extremity was prepped and draped in usual sterile fashion. Using a lateral decubitus position and all bony prominence padded. He had a beanbag in place.  Timeout was performed. Incision was made through his previous incision. The flap was elevated full thickness. I excised any necrotic tissue. There was a purulent pocket of pus that was deep directly on the plate being through some of the bone of the calcaneus. The plate was exposed and all screws removed. The bone was rongeured back to clean healthy bone, and curette was also used to debride the bone back to healthy tissue.  Once complete debridement had been performed, using both sharp excision using the scalpel as well as a rongeur and curettes the wound was irrigated with 6 L of saline using pulse lavage. A clean bed of tissue was achieved. I then loosely reapproximated the skin with nylon sutures, and reapplied the wound VAC over the top, as an incisional wound VAC. The patient was then dressed with sterile gauze, and awakened and returned to the PACU in stable and satisfactory condition. There no complications and he tolerated the procedure well. 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 We will probably do another 8 weeks  of IV antibiotics, and long-term suppressive antibiotics may also be appropriate. We will plan to get infectious disease consultation involved again.

## 2011-01-02 NOTE — Transfer of Care (Signed)
Immediate Anesthesia Transfer of Care Note  Patient: [name]  Procedure(s) Performed:  IRRIGATION AND DEBRIDEMENT EXTREMITY - I&D Right Calcaneus with Removal of Plate and Screws; wound vac Placement  Patient Location: PACU  Anesthesia Type: General  Level of Consciousness: sedated  Airway & Oxygen Therapy: Patient connected to nasal cannula oxygen  Post-op Assessment: Report given to PACU RN  Post vital signs: Reviewed and stable  Complications: No apparent anesthesia complications

## 2011-01-03 ENCOUNTER — Encounter (HOSPITAL_COMMUNITY): Payer: Self-pay | Admitting: Orthopedic Surgery

## 2011-01-03 DIAGNOSIS — M009 Pyogenic arthritis, unspecified: Secondary | ICD-10-CM

## 2011-01-03 LAB — CBC
Hemoglobin: 7.9 g/dL — ABNORMAL LOW (ref 13.0–17.0)
MCHC: 28.6 g/dL — ABNORMAL LOW (ref 30.0–36.0)
Platelets: 459 10*3/uL — ABNORMAL HIGH (ref 150–400)
RDW: 18.3 % — ABNORMAL HIGH (ref 11.5–15.5)

## 2011-01-03 LAB — BASIC METABOLIC PANEL
GFR calc Af Amer: >90 mL/min (ref >90)
GFR calc non Af Amer: >90 mL/min (ref >90)
Potassium: 4.1 mEq/L (ref 3.5–5.1)
Sodium: 140 mEq/L (ref 135–145)

## 2011-01-03 MED ORDER — SODIUM CHLORIDE 0.9 % IJ SOLN
10.0000 mL | INTRAMUSCULAR | Status: DC | PRN
  Administered 2011-01-04 – 2011-01-05 (×2): 10 mL

## 2011-01-03 MED ORDER — OXYCODONE-ACETAMINOPHEN 5-325 MG PO TABS
1.0000 | ORAL_TABLET | Freq: Four times a day (QID) | ORAL | Status: DC | PRN
  Administered 2011-01-03 – 2011-01-05 (×6): 2 via ORAL
  Filled 2011-01-03 (×6): qty 2

## 2011-01-03 MED ORDER — POTASSIUM CHLORIDE IN NACL 20-0.45 MEQ/L-% IV SOLN
INTRAVENOUS | Status: DC
  Administered 2011-01-03 – 2011-01-04 (×3): via INTRAVENOUS
  Filled 2011-01-03 (×7): qty 1000

## 2011-01-03 MED ORDER — PHENOL 1.4 % MT LIQD: 1.0000 | OROMUCOSAL | Status: DC | PRN

## 2011-01-03 MED ORDER — MENTHOL 3 MG MT LOZG: 1.0000 | LOZENGE | OROMUCOSAL | Status: DC | PRN

## 2011-01-03 MED ORDER — METOCLOPRAMIDE HCL 10 MG PO TABS: 5.0000 mg | ORAL_TABLET | Freq: Three times a day (TID) | ORAL | Status: DC | PRN

## 2011-01-03 MED ORDER — ACETAMINOPHEN 325 MG PO TABS: 650.0000 mg | ORAL_TABLET | Freq: Four times a day (QID) | ORAL | Status: DC | PRN

## 2011-01-03 MED ORDER — METOCLOPRAMIDE HCL 5 MG/ML IJ SOLN: 5.0000 mg | Freq: Three times a day (TID) | INTRAMUSCULAR | Status: DC | PRN

## 2011-01-03 MED ORDER — ZOLPIDEM TARTRATE 5 MG PO TABS: 5.0000 mg | ORAL_TABLET | Freq: Every evening | ORAL | Status: DC | PRN

## 2011-01-03 MED ORDER — HYDROMORPHONE HCL PF 1 MG/ML IJ SOLN
1.0000 mg | INTRAMUSCULAR | Status: DC | PRN
  Administered 2011-01-03 – 2011-01-05 (×23): 1 mg via INTRAVENOUS
  Filled 2011-01-03 (×25): qty 1

## 2011-01-03 MED ORDER — OXYCODONE HCL 5 MG PO TABS: 5.0000 mg | ORAL_TABLET | Freq: Four times a day (QID) | ORAL | Status: DC | PRN

## 2011-01-03 MED ORDER — HYDROMORPHONE HCL PF 1 MG/ML IJ SOLN
0.2500 mg | INTRAMUSCULAR | Status: DC | PRN
  Administered 2011-01-03: 0.5 mg via INTRAVENOUS
  Filled 2011-01-03: qty 1

## 2011-01-03 MED ORDER — PANTOPRAZOLE SODIUM 40 MG PO TBEC
40.0000 mg | DELAYED_RELEASE_TABLET | Freq: Two times a day (BID) | ORAL | Status: DC
  Administered 2011-01-03 – 2011-01-04 (×4): 40 mg via ORAL
  Filled 2011-01-03 (×4): qty 1

## 2011-01-03 MED ORDER — GABAPENTIN 100 MG PO CAPS
100.0000 mg | ORAL_CAPSULE | Freq: Three times a day (TID) | ORAL | Status: DC
  Administered 2011-01-03 – 2011-01-04 (×5): 100 mg via ORAL
  Filled 2011-01-03 (×9): qty 1

## 2011-01-03 MED ORDER — ACETAMINOPHEN 650 MG RE SUPP: 650.0000 mg | Freq: Four times a day (QID) | RECTAL | Status: DC | PRN

## 2011-01-03 MED ORDER — ONDANSETRON HCL 4 MG/2ML IJ SOLN: 4.0000 mg | Freq: Four times a day (QID) | INTRAMUSCULAR | Status: DC | PRN

## 2011-01-03 MED ORDER — ADULT MULTIVITAMIN W/MINERALS CH
1.0000 | ORAL_TABLET | Freq: Every day | ORAL | Status: DC
  Administered 2011-01-03 – 2011-01-04 (×2): 1 via ORAL
  Filled 2011-01-03 (×3): qty 1

## 2011-01-03 MED ORDER — METHOCARBAMOL 500 MG PO TABS
500.0000 mg | ORAL_TABLET | Freq: Four times a day (QID) | ORAL | Status: DC | PRN
  Administered 2011-01-03 (×2): 500 mg via ORAL
  Filled 2011-01-03 (×2): qty 1

## 2011-01-03 MED ORDER — DROPERIDOL 2.5 MG/ML IJ SOLN
0.6250 mg | INTRAMUSCULAR | Status: DC | PRN
  Filled 2011-01-03: qty 0.25

## 2011-01-03 MED ORDER — CEFAZOLIN SODIUM 1-5 GM-% IV SOLN
1.0000 g | Freq: Three times a day (TID) | INTRAVENOUS | Status: DC
  Administered 2011-01-03 – 2011-01-04 (×6): 1 g via INTRAVENOUS
  Filled 2011-01-03 (×9): qty 50

## 2011-01-03 MED ORDER — ENOXAPARIN SODIUM 40 MG/0.4ML ~~LOC~~ SOLN
40.0000 mg | SUBCUTANEOUS | Status: DC
  Administered 2011-01-03 – 2011-01-04 (×2): 40 mg via SUBCUTANEOUS
  Filled 2011-01-03 (×3): qty 0.4

## 2011-01-03 MED ORDER — ONDANSETRON HCL 4 MG PO TABS: 4.0000 mg | ORAL_TABLET | Freq: Four times a day (QID) | ORAL | Status: DC | PRN

## 2011-01-03 MED ORDER — CITALOPRAM HYDROBROMIDE 20 MG PO TABS
20.0000 mg | ORAL_TABLET | Freq: Every day | ORAL | Status: DC
  Administered 2011-01-03 – 2011-01-04 (×2): 20 mg via ORAL
  Filled 2011-01-03 (×3): qty 1

## 2011-01-03 MED ORDER — RIFAMPIN 300 MG PO CAPS
600.0000 mg | ORAL_CAPSULE | Freq: Every day | ORAL | Status: DC
  Administered 2011-01-03 – 2011-01-04 (×2): 600 mg via ORAL
  Filled 2011-01-03 (×3): qty 2

## 2011-01-03 MED ORDER — METHOCARBAMOL 100 MG/ML IJ SOLN: 500.0000 mg | Freq: Four times a day (QID) | INTRAVENOUS | Status: DC | PRN

## 2011-01-03 NOTE — Anesthesia Postprocedure Evaluation (Signed)
Anesthesia Post Note  Patient: [name]  Procedure(s) Performed:  IRRIGATION AND DEBRIDEMENT EXTREMITY - I&D Right Calcaneus with Removal of Plate and Screws; wound vac Placement  Anesthesia type: General  Patient location: PACU  Post pain: Pain level controlled  Post assessment: Patient's Cardiovascular Status Stable  Last Vitals:  Filed Vitals:   01/03/11 0015  BP:   Pulse: 105  Temp: 36.7 C  Resp: 16    Post vital signs: Reviewed and stable  Level of consciousness: sedated  Complications: No apparent anesthesia complications

## 2011-01-03 NOTE — Progress Notes (Signed)
Occupational Therapy Evaluation Patient Details Name: [name] MRN: [id number] DOB: 1960/10/18 Today's Date: 01/03/2011  Problem List:  Patient Active Problem List  Diagnoses  . GI bleed  . Depression  . Substance abuse  . Anemia associated with acute blood loss, postoperative  . Postoperative infection, left shoulder hemiarthroplasty  . H/O: GI bleed  . Fracture of humerus, proximal, left, open    Past Medical History:  Past Medical History  Diagnosis Date  . GI bleed   . Proximal humerus fracture 07/17/2010    with dislocation, repaired 07/17/2010  . Depression   . Substance abuse   . H/O: GI bleed   . Fracture of humerus, proximal, left, open 07/17/2010    repaired 07/21/2010  . Frequent falls     sec. to alcohol abuse  . Pneumonia     20+yrs ago  . Headache   . Dizziness   . Confusion   . Bruise     left shoulder  . Gastric ulcer   . GERD (gastroesophageal reflux disease)     takes Protonix daily  . Kidney stones     hx of  . Anxiety   . Insomnia    Past Surgical History:  Past Surgical History  Procedure Date  . Fracture surgery 07/17/2010    ORIF left proximal humerus dislocation  . Shoulder surgery     left x 2   . Heel spur surgery     right    OT Assessment/Plan/Recommendation OT Assessment OT Recommendation/Assessment: Patient does not need any further OT services OT Goals    OT Evaluation Precautions/Restrictions  Precautions Required Braces or Orthoses: Yes Other Brace/Splint: [place] wearing PTA Restrictions Weight Bearing Restrictions: No Prior Functioning Home Living Lives With: Family Receives Help From: Family Type of Home: House Home Layout: One level Home Access: Stairs to enter Entrance Stairs-Rails: Can reach both Entrance Stairs-Number of Steps: 2 Bathroom Shower/Tub: Other (comment) (sponge bath) Bathroom Toilet: Standard Bathroom Accessibility: Yes How Accessible: Accessible via walker Home Adaptive Equipment:  Crutches Prior Function Level of Independence: Independent with basic ADLs;Independent with gait;Independent with transfers Able to Take Stairs?: Yes Driving: No Vocation: On disability ADL ADL Eating/Feeding: Performed;Modified independent Where Assessed - Eating/Feeding: Edge of bed Grooming: Performed;Wash/dry hands;Wash/dry face;Shaving;Modified independent;Other (comment) (washed hair at the sink) Where Assessed - Grooming: Standing at sink;Sitting, bed;Unsupported Upper Body Bathing: Performed;Chest;Right arm;Left arm;Abdomen;Modified independent Where Assessed - Upper Body Bathing: Sitting, bed;Unsupported Lower Body Bathing: Performed;Modified independent Where Assessed - Lower Body Bathing: Sit to stand from bed Upper Body Dressing: Performed;Modified independent Where Assessed - Upper Body Dressing: Sitting, bed;Unsupported Lower Body Dressing: Performed;Modified independent Where Assessed - Lower Body Dressing: Sit to stand from bed Equipment Used: Other (comment) (declined cam boot so sock placed on RLE) ADL Comments: Pt slightly impulsive. Pt even with therapist request to don cam boot with education as to why the boot should be don pt declined states "nah lets just go to the sink" then stood up. Pt provided sock over RLE to prevent slipping, decr risk of infection and advised pt multiple times to don cam boot.  Vision/Perception  Vision - History Baseline Vision: No visual deficits Patient Visual Report: No change from baseline Cognition Cognition Arousal/Alertness: Awake/alert Overall Cognitive Status: Appears within functional limits for tasks assessed Orientation Level: Oriented X4 Sensation/Coordination Coordination Gross Motor Movements are Fluid and Coordinated: Yes Fine Motor Movements are Fluid and Coordinated: Yes Extremity Assessment RUE Assessment RUE Assessment: Within Functional Limits LUE Assessment LUE Assessment:  Within Functional Limits Mobility    Bed Mobility Bed Mobility: No Transfers Transfers: Yes Sit to Stand: 6: Modified independent (Device/Increase time);With upper extremity assist;From bed Stand to Sit: 6: Modified independent (Device/Increase time);To chair/3-in-1;With upper extremity assist Exercises   End of Session OT - End of Session Activity Tolerance: Patient tolerated treatment well Patient left: in chair;with call bell in reach Nurse Communication: Mobility status for transfers General Behavior During Session: [facility] for tasks performed Cognition: [facility] for tasks performed   Lucile Shutters 01/03/2011, 11:38 AM  Pager: [phone]

## 2011-01-03 NOTE — Progress Notes (Signed)
Patient ID: [name], male   DOB: 03-Nov-1960, 50 y.o.   MRN: [id number] Procedure(s) (LRB): IRRIGATION AND DEBRIDEMENT EXTREMITY (Right) 1 Day Post-Op   Subjective:  Patient reports pain as moderate.  Controlled with iv dilaudid.  Objective:   VITALS:  BP 104/77  Pulse 99  Temp(Src) 97.9 F (36.6 C) (Oral)  Resp 20  Ht 5\' 7"  (1.702 m)  Wt 49.896 kg (110 lb)  BMI 17.23 kg/m2  SpO2 95%  Neurologically intact Compartment soft Wound vac in place.  LABS Lab Results  Component Value Date   HGB 7.9* 01/03/2011   HGB 9.5* 01/02/2011   HGB 8.5* 11/09/2010   Lab Results  Component Value Date   WBC 8.3 01/03/2011   PLT 459* 01/03/2011   Lab Results  Component Value Date   INR 1.12 10/13/2010   Lab Results  Component Value Date   NA 140 01/03/2011   K 4.1 01/03/2011   CL 103 01/03/2011   CO2 27 01/03/2011   BUN 20 01/03/2011   CREATININE 0.92 01/03/2011   GLUCOSE 101* 01/03/2011   Results for orders placed during the hospital encounter of 01/02/11  SURGICAL PCR SCREEN     Status: Abnormal   Collection Time   01/02/11  2:24 PM      Component Value Range Status Comment   MRSA, PCR NEGATIVE  NEGATIVE  Final    Staphylococcus aureus POSITIVE (*) NEGATIVE  Final     Assessment/Plan: Infected right calcaneus fracture, s/p removal of plate and repeat I&D.  Will get ID consult, picc line, MSSA before,   Advance diet Up with therapy Continue ABX therapy due to Post-op infection  Involvement of bone.  May dc home this Friday with iv abx if arranged.     Evani Shrider P 01/03/2011, 10:10 AM

## 2011-01-03 NOTE — Consult Note (Signed)
Date of Admission:  01/02/2011  Date of Consult:  01/03/2011  Reason for Consult Infected calcaneal fracture right  Referring Physician: Dr. Dion Saucier   HPI: [name] is an 50 y.o. male. With history of prior ETOHism, humerus fracturefollowed by hemiarthroplasty, and 2 years later had a periprosthetic fracture dislocation In July  that underwent longstem ORIF with prosthetic replacement, that subsequently had infection with multiple surgical debridements.= and MSSA isolated rx with ancef and rifampin.  This has not been draining, but periodically does have some drainage from that proximal shoulder wound is well. Marland Kitchen He sustained yet another fall with calcaneal fracture and ORIF in 10/13/10  That became infected with MSSA  And Proteus and was I and D'd  In October with IV antibiotics into November. He had been off antibioticsfor at least a couple of weeks, and has had recurrent drainage and purulence with foul-smelling coming from his heel. He was admitted by Dr. Kalman Shan and he has performed I and D with removal of hardware, deep, debridement of skin, subcutaneous tissue, tendon, and bone. Placement of small wound VAC. He is on ancef and rifampin       Past Medical History  Diagnosis Date  . GI bleed   . Proximal humerus fracture 07/17/2010    with dislocation, repaired 07/17/2010  . Depression   . Substance abuse   . H/O: GI bleed   . Fracture of humerus, proximal, left, open 07/17/2010    repaired 07/21/2010  . Frequent falls     sec. to alcohol abuse  . Pneumonia     20+yrs ago  . Headache   . Dizziness   . Confusion   . Bruise     left shoulder  . Gastric ulcer   . GERD (gastroesophageal reflux disease)     takes Protonix daily  . Kidney stones     hx of  . Anxiety   . Insomnia     Past Surgical History  Procedure Date  . Fracture surgery 07/17/2010    ORIF left proximal humerus dislocation  . Shoulder surgery     left x 2   . Heel spur surgery     right  . I&d  extremity 01/02/2011    Procedure: IRRIGATION AND DEBRIDEMENT EXTREMITY;  Surgeon: Eulas Post;  Location: [facility] OR;  Service: Orthopedics;  Laterality: Right;  I&D Right Calcaneus with Removal of Plate and Screws; wound vac Placement  ergies:   No Known Allergies   Medications: I have reviewed patients current medications as documented in Epic Anti-infectives     Start     Dose/Rate Route Frequency Ordered Stop   01/03/11 2200   rifampin (RIFADIN) capsule 600 mg        600 mg Oral Daily at bedtime 01/03/11 0506     01/03/11 0600   ceFAZolin (ANCEF) IVPB 1 g/50 mL premix        1 g 100 mL/hr over 30 Minutes Intravenous 3 times per day 01/03/11 0053 01/31/11 0559   01/02/11 1352   ceFAZolin (ANCEF) IVPB 1 g/50 mL premix  Status:  Discontinued        1 g 100 mL/hr over 30 Minutes Intravenous 60 min pre-op 01/02/11 1352 01/02/11 1933          Social History:  reports that he has been smoking.  He does not have any smokeless tobacco history on file. He reports that he uses illicit drugs (Marijuana). He reports that he does not drink  alcohol.  Family History  Problem Relation Age of Onset  . Anesthesia problems Neg Hx   . Hypotension Neg Hx   . Malignant hyperthermia Neg Hx   . Pseudochol deficiency Neg Hx     As in HPI and primary teams notes otherwise 12 point review of systems is negative  Blood pressure 105/69, pulse 96, temperature 98.5 F (36.9 C), temperature source Oral, resp. rate 18, height 5\' 7"  (1.702 m), weight 110 lb (49.896 kg), SpO2 94.00%. General: Alert and awake, oriented x3, not in any acute distress. HEENT: anicteric sclera, pupils reactive to light and accommodation, EOMI, oropharynx clear and without exudate CVS regular rate, normal r,  no murmur rubs or gallops Chest: clear to auscultation bilaterally, no wheezing, rales or rhonchi Abdomen: soft nontender, nondistended, normal bowel sounds, y Skin: no rashes Neuro: nonfocal, strength and sensation  intact MS: his left shoulder with clean wound, without purulence or tenderness on exam, right ankle with wound vacuum dressing   Results for orders placed during the hospital encounter of 01/02/11 (from the past 48 hour(s))  CBC     Status: Abnormal   Collection Time   01/02/11  1:58 PM      Component Value Range Comment   WBC 12.3 (*) 4.0 - 10.5 (K/uL)    RBC 4.20 (*) 4.22 - 5.81 (MIL/uL)    Hemoglobin 9.5 (*) 13.0 - 17.0 (g/dL)    HCT 96.0 (*) 45.4 - 52.0 (%)    MCV 78.8  78.0 - 100.0 (fL)    MCH 22.6 (*) 26.0 - 34.0 (pg)    MCHC 28.7 (*) 30.0 - 36.0 (g/dL)    RDW 09.8 (*) 11.9 - 15.5 (%)    Platelets 580 (*) 150 - 400 (K/uL)   BASIC METABOLIC PANEL     Status: Abnormal   Collection Time   01/02/11  1:58 PM      Component Value Range Comment   Sodium 134 (*) 135 - 145 (mEq/L)    Potassium 3.8  3.5 - 5.1 (mEq/L)    Chloride 99  96 - 112 (mEq/L)    CO2 21  19 - 32 (mEq/L)    Glucose, Bld 101 (*) 70 - 99 (mg/dL)    BUN 22  6 - 23 (mg/dL)    Creatinine, Ser 1.47  0.50 - 1.35 (mg/dL)    Calcium 9.4  8.4 - 10.5 (mg/dL)    GFR calc non Af Amer 74 (*) >90 (mL/min)    GFR calc Af Amer 86 (*) >90 (mL/min)   SEDIMENTATION RATE     Status: Abnormal   Collection Time   01/02/11  1:58 PM      Component Value Range Comment   Sed Rate 59 (*) 0 - 16 (mm/hr)   SURGICAL PCR SCREEN     Status: Abnormal   Collection Time   01/02/11  2:24 PM      Component Value Range Comment   MRSA, PCR NEGATIVE  NEGATIVE     Staphylococcus aureus POSITIVE (*) NEGATIVE    ANAEROBIC CULTURE     Status: Normal (Preliminary result)   Collection Time   01/03/11 12:17 AM      Component Value Range Comment   Specimen Description WOUND      Special Requests RIGHT CALCANEUS      Gram Stain        Value: ABUNDANT WBC PRESENT,BOTH PMN AND MONONUCLEAR     NO SQUAMOUS EPITHELIAL CELLS SEEN     NO  ORGANISMS SEEN   Culture PENDING      Report Status PENDING     TISSUE CULTURE     Status: Normal (Preliminary  result)   Collection Time   01/03/11 12:18 AM      Component Value Range Comment   Specimen Description TISSUE      Special Requests RIGHT CALCANEUS      Gram Stain        Value: RARE WBC PRESENT, PREDOMINANTLY PMN     NO ORGANISMS SEEN   Culture PENDING      Report Status PENDING     CBC     Status: Abnormal   Collection Time   01/03/11  5:17 AM      Component Value Range Comment   WBC 8.3  4.0 - 10.5 (K/uL)    RBC 3.51 (*) 4.22 - 5.81 (MIL/uL)    Hemoglobin 7.9 (*) 13.0 - 17.0 (g/dL) DELTA CHECK NOTED   HCT 27.6 (*) 39.0 - 52.0 (%)    MCV 78.6  78.0 - 100.0 (fL)    MCH 22.5 (*) 26.0 - 34.0 (pg)    MCHC 28.6 (*) 30.0 - 36.0 (g/dL)    RDW 16.1 (*) 09.6 - 15.5 (%)    Platelets 459 (*) 150 - 400 (K/uL)   BASIC METABOLIC PANEL     Status: Abnormal   Collection Time   01/03/11  5:17 AM      Component Value Range Comment   Sodium 140  135 - 145 (mEq/L)    Potassium 4.1  3.5 - 5.1 (mEq/L)    Chloride 103  96 - 112 (mEq/L)    CO2 27  19 - 32 (mEq/L)    Glucose, Bld 101 (*) 70 - 99 (mg/dL)    BUN 20  6 - 23 (mg/dL)    Creatinine, Ser 0.45  0.50 - 1.35 (mg/dL)    Calcium 8.9  8.4 - 10.5 (mg/dL)    GFR calc non Af Amer >90  >90 (mL/min)    GFR calc Af Amer >90  >90 (mL/min)       Component Value Date/Time   SDES TISSUE 01/03/2011 0018   SPECREQUEST RIGHT CALCANEUS 01/03/2011 0018   CULT PENDING 01/03/2011 0018   REPTSTATUS PENDING 01/03/2011 0018   Dg Chest 2 View  01/02/2011  *RADIOLOGY REPORT*  Clinical Data: Preoperative respiratory exam.  Right foot infection.  CHEST - 2 VIEW  Comparison: 10/30/2010  Findings: Heart size and vascularity are normal and the lungs are clear of acute infiltrates.  Healing fractures of bilateral ribs. Scarring in the lung apices.  Prosthesis in the proximal left humerus, unchanged.  IMPRESSION: No acute abnormality.  Healing bilateral rib fractures.  Original Report Authenticated By: Gwynn Burly, M.D.     Recent Results (from the past  720 hour(s))  SURGICAL PCR SCREEN     Status: Abnormal   Collection Time   01/02/11  2:24 PM      Component Value Range Status Comment   MRSA, PCR NEGATIVE  NEGATIVE  Final    Staphylococcus aureus POSITIVE (*) NEGATIVE  Final   ANAEROBIC CULTURE     Status: Normal (Preliminary result)   Collection Time   01/03/11 12:17 AM      Component Value Range Status Comment   Specimen Description WOUND   Final    Special Requests RIGHT CALCANEUS   Final    Gram Stain     Final    Value: ABUNDANT WBC PRESENT,BOTH PMN  AND MONONUCLEAR     NO SQUAMOUS EPITHELIAL CELLS SEEN     NO ORGANISMS SEEN   Culture PENDING   Incomplete    Report Status PENDING   Incomplete   TISSUE CULTURE     Status: Normal (Preliminary result)   Collection Time   01/03/11 12:18 AM      Component Value Range Status Comment   Specimen Description TISSUE   Final    Special Requests RIGHT CALCANEUS   Final    Gram Stain     Final    Value: RARE WBC PRESENT, PREDOMINANTLY PMN     NO ORGANISMS SEEN   Culture PENDING   Incomplete    Report Status PENDING   Incomplete      Impression/Recommendation  50 year old with history of prosthetic shoulder infection in July, then calcaneal fracture and infection sp i and D in [facility] and now with recurrence of infection sp removal of hardware during this admission.   1) Calcaneal hardware associated osteomyelitis: --agree with ancef (and rifampin not necessary here if hardware out but worry about his shoulder still) ---would treat with 6 weeks of IV ancef with followup in [facility]  2) Infected prosthetic shoulder: I dont like hearing that he has had drainage from here. Does this not possibly need further I and D? Will discuss with Dr. Dion Saucier in am. Would feel better with him being on rifampin and the ancef  3) Screening: check for hiv and hep viruses  Thank you so much for this interesting consult,   Acey Lav 01/03/2011, 6:00 PM   [phone] (pager) [phone]  (office)

## 2011-01-03 NOTE — Progress Notes (Signed)
Physical Therapy Evaluation Patient Details Name: NYRON MOZER MRN: [id number] DOB: September 03, 1960 Today's Date: 01/03/2011  Problem List:  Patient Active Problem List  Diagnoses  . GI bleed  . Depression  . Substance abuse  . Anemia associated with acute blood loss, postoperative  . Postoperative infection, left shoulder hemiarthroplasty  . H/O: GI bleed  . Fracture of humerus, proximal, left, open    Past Medical History:  Past Medical History  Diagnosis Date  . GI bleed   . Proximal humerus fracture 07/17/2010    with dislocation, repaired 07/17/2010  . Depression   . Substance abuse   . H/O: GI bleed   . Fracture of humerus, proximal, left, open 07/17/2010    repaired 07/21/2010  . Frequent falls     sec. to alcohol abuse  . Pneumonia     20+yrs ago  . Headache   . Dizziness   . Confusion   . Bruise     left shoulder  . Gastric ulcer   . GERD (gastroesophageal reflux disease)     takes Protonix daily  . Kidney stones     hx of  . Anxiety   . Insomnia    Past Surgical History:  Past Surgical History  Procedure Date  . Fracture surgery 07/17/2010    ORIF left proximal humerus dislocation  . Shoulder surgery     left x 2   . Heel spur surgery     right    PT Assessment/Plan/Recommendation PT Assessment Clinical Impression Statement: Pt is 50 yo male with multiple right ankle surgeries who returns for I & D and hardware removal.  Pt's mobility limited by pain and fatigue.  Plan to follow pt to increase safety with mobility and increase strength.  Recommend RW at d/c, most likely will not need f/u PT at home but will continue to assess as he progresses PT Recommendation/Assessment: Patient will need skilled PT in the acute care venue PT Problem List: Decreased strength;Decreased activity tolerance;Decreased balance;Decreased mobility;Decreased knowledge of use of DME;Decreased safety awareness;Pain Barriers to Discharge: None PT Therapy Diagnosis : Abnormality of  gait;Acute pain PT Plan PT Frequency: Min 3X/week PT Treatment/Interventions: DME instruction;Gait training;Stair training;Functional mobility training;Therapeutic activities;Therapeutic exercise;Balance training;Patient/family education PT Recommendation Follow Up Recommendations: None Equipment Recommended: Rolling walker with 5" wheels PT Goals  Acute Rehab PT Goals PT Goal Formulation: With patient Time For Goal Achievement: 7 days Pt will go Sit to Stand: Independently PT Goal: Sit to Stand - Progress: Progressing toward goal Pt will go Stand to Sit: Independently PT Goal: Stand to Sit - Progress: Progressing toward goal Pt will Ambulate: >150 feet;with modified independence;with least restrictive assistive device PT Goal: Ambulate - Progress: Progressing toward goal Pt will Go Up / Down Stairs: 1-2 stairs;with rail(s);with modified independence PT Goal: Up/Down Stairs - Progress: Other (comment) (NT) Pt will Perform Home Exercise Program: Independently PT Goal: Perform Home Exercise Program - Progress: Progressing toward goal  PT Evaluation Precautions/Restrictions  Precautions Precautions: Other (comment) (wound vac right LE) Required Braces or Orthoses: Yes Other Brace/Splint: cam boot Restrictions Weight Bearing Restrictions: Yes RLE Weight Bearing: Weight bearing as tolerated Prior Functioning  Home Living Lives With: Family Receives Help From: Family Type of Home: House Home Layout: One level Home Access: Stairs to enter Entrance Stairs-Rails: Can reach both Entrance Stairs-Number of Steps: 2 Bathroom Shower/Tub: Other (comment) (sponge baths) Bathroom Toilet: Standard Bathroom Accessibility: Yes How Accessible: Accessible via walker Home Adaptive Equipment: Crutches Prior Function Level of Independence:  Independent with basic ADLs;Independent with gait;Independent with transfers Able to Take Stairs?: Yes Driving: No Vocation: Unemployed Vocation  Requirements: 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 and might need to go on disability Cognition Cognition Arousal/Alertness: Awake/alert Overall Cognitive Status: Impaired Orientation Level: Oriented X4 Safety/Judgement: Decreased awareness of safety precautions;Decreased safety judgement for tasks assessed Decreased Safety/Judgement: Impulsive Sensation/Coordination Sensation Light Touch: Not tested Stereognosis: Not tested Hot/Cold: Not tested Proprioception: Appears Intact Coordination Gross Motor Movements are Fluid and Coordinated: Yes Fine Motor Movements are Fluid and Coordinated: Yes Extremity Assessment RUE Assessment RUE Assessment: Within Functional Limits LUE Assessment LUE Assessment: Within Functional Limits RLE Assessment RLE Assessment: Exceptions to [facility] RLE Strength RLE Overall Strength Comments: grossly 4/5 throughout but pt fatigues quickly with functional activities LLE Assessment LLE Assessment: Exceptions to [facility] LLE Strength LLE Overall Strength Comments: grossly 4/5 throughout but fatigues quickly with functional activities Mobility (including Balance) Bed Mobility Bed Mobility: Yes Supine to Sit: 7: Independent Sitting - Scoot to Edge of Bed: 7: Independent Sit to Supine - Right: 7: Independent Scooting to HOB: 7: Independent Transfers Transfers: Yes Sit to Stand: 6: Modified independent (Device/Increase time);From bed;Without upper extremity assist Stand to Sit: 6: Modified independent (Device/Increase time);To bed;Without upper extremity assist Ambulation/Gait Ambulation/Gait: Yes Ambulation/Gait Assistance: 4: Min assist Ambulation/Gait Assistance Details (indicate cue type and reason): pt's impusivity apparent with ambulation, vc's to take time and take care on turns to not pick RW up off floor.  Pt ambulates with RW too far in front and corrects with vc's but soon returns to same posture.   Also makes sudden mvmts without regard for wound vac/ IV pole.  Pt was fatigued by end of ambulation and was rushing to get back to bed. Ambulation Distance (Feet): 200 Feet Assistive device: Rolling walker Gait Pattern: Step-through pattern;Trunk flexed Gait velocity: normal Stairs: No Wheelchair Mobility Wheelchair Mobility: No  Posture/Postural Control Posture/Postural Control: No significant limitations Balance Balance Assessed: Yes Dynamic Standing Balance Dynamic Standing - Level of Assistance: 4: Min assist Exercise  General Exercises - Lower Extremity Ankle Circles/Pumps: AROM;Both;15 reps;Supine Long Arc Quad: AROM;Strengthening;Both;10 reps;Seated Heel Slides: Left;Strengthening;AROM;10 reps;Supine Straight Leg Raises: AROM;Both;Strengthening;10 reps;Supine End of Session PT - End of Session Equipment Utilized During Treatment: Gait belt;Other (comment) (cam boot) Activity Tolerance: Patient limited by fatigue;Patient limited by pain Patient left: in bed;with call bell in reach Nurse Communication: Mobility status for transfers;Mobility status for ambulation General Behavior During Session: Other (comment) (pt seems depressed about current state of health) Cognition: Impaired (impulsive)  Brayln Duque, [place]  [phone] 01/03/2011, 2:15 PM

## 2011-01-04 ENCOUNTER — Encounter (HOSPITAL_COMMUNITY): Payer: Self-pay | Admitting: Orthopedic Surgery

## 2011-01-04 DIAGNOSIS — M869 Osteomyelitis, unspecified: Secondary | ICD-10-CM

## 2011-01-04 DIAGNOSIS — S92001A Unspecified fracture of right calcaneus, initial encounter for closed fracture: Secondary | ICD-10-CM

## 2011-01-04 HISTORY — DX: Unspecified fracture of right calcaneus, initial encounter for closed fracture: S92.001A

## 2011-01-04 HISTORY — DX: Osteomyelitis, unspecified: M86.9

## 2011-01-04 LAB — BASIC METABOLIC PANEL
Chloride: 99 mEq/L (ref 96–112)
GFR calc Af Amer: >90 mL/min (ref >90)
Potassium: 4.3 mEq/L (ref 3.5–5.1)
Sodium: 133 mEq/L — ABNORMAL LOW (ref 135–145)

## 2011-01-04 MED ORDER — CEFAZOLIN SODIUM-DEXTROSE 2-3 GM-% IV SOLR
2.0000 g | Freq: Three times a day (TID) | INTRAVENOUS | Status: DC
  Administered 2011-01-05: 2 g via INTRAVENOUS
  Filled 2011-01-04 (×3): qty 50

## 2011-01-04 MED ORDER — OXYCODONE-ACETAMINOPHEN 10-325 MG PO TABS: 1.0000 | ORAL_TABLET | Freq: Four times a day (QID) | ORAL | Status: AC | PRN

## 2011-01-04 MED ORDER — RIFAMPIN 300 MG PO CAPS: 600.0000 mg | ORAL_CAPSULE | Freq: Every day | ORAL | Status: DC

## 2011-01-04 MED ORDER — METHOCARBAMOL 500 MG PO TABS: 500.0000 mg | ORAL_TABLET | Freq: Four times a day (QID) | ORAL | Status: AC

## 2011-01-04 MED ORDER — CEFAZOLIN SODIUM 1-5 GM-% IV SOLN: 2.0000 g | Freq: Three times a day (TID) | INTRAVENOUS | Status: DC

## 2011-01-04 MED ORDER — HYDROMORPHONE HCL 2 MG PO TABS: 2.0000 mg | ORAL_TABLET | ORAL | Status: AC | PRN

## 2011-01-04 NOTE — Progress Notes (Signed)
Subjective: 2 Days Post-Op Procedure(s) (LRB): IRRIGATION AND DEBRIDEMENT EXTREMITY (Right) Patient reports pain as 8 on 0-10 scale.   He wanted me to up his dilaudid to 2 mg.  I told him we would not do that given his previous history Objective: Vital signs in last 24 hours: Temp:  [97.7 F (36.5 C)-98.5 F (36.9 C)] 97.7 F (36.5 C) (12/20 0558) Pulse Rate:  [85-96] 85  (12/20 0558) Resp:  [18] 18  (12/20 0558) BP: (105-139)/(69-72) 139/71 mmHg (12/20 0558) SpO2:  [93 %-100 %] 93 % (12/20 0558)  Intake/Output from previous day: 12/19 0701 - 12/20 0700 In: -  Out: 1900 [Urine:1900] Intake/Output this shift:     Basename 01/03/11 0517 01/02/11 1358  HGB 7.9* 9.5*    Basename 01/03/11 0517 01/02/11 1358  WBC 8.3 12.3*  RBC 3.51* 4.20*  HCT 27.6* 33.1*  PLT 459* 580*    Basename 01/04/11 0545 01/03/11 0517  NA 133* 140  K 4.3 4.1  CL 99 103  CO2 30 27  BUN 12 20  CREATININE 0.72 0.92  GLUCOSE 102* 101*  CALCIUM 8.6 8.9   No results found for this basename: LABPT:2,INR:2 in the last 72 hours  Sensation intact distally Dorsiflexion/Plantar flexion intact Incision: dressing C/D/I Wound vac in place and active. Assessment/Plan: 2 Days Post-Op Procedure(s) (LRB): IRRIGATION AND DEBRIDEMENT EXTREMITY (Right) Advance diet Up with therapy Plan for discharge tomorrow Alyson Reedy 01/04/2011, 1:13 PM

## 2011-01-04 NOTE — Progress Notes (Signed)
Patient ID: [name], male   DOB: 15-Sep-1960, 50 y.o.   MRN: [id number] Procedure(s) (LRB): IRRIGATION AND DEBRIDEMENT EXTREMITY (Right) 2 Days Post-Op   Subjective:  Patient reports pain as marked.  Doing ok otherwise.  Objective:   VITALS:  BP 139/71  Pulse 85  Temp(Src) 97.7 F (36.5 C) (Oral)  Resp 18  Ht 5\' 7"  (1.702 m)  Wt 49.896 kg (110 lb)  BMI 17.23 kg/m2  SpO2 93%  Neurologically intact Vac intact.  LABS Lab Results  Component Value Date   HGB 7.9* 01/03/2011   HGB 9.5* 01/02/2011   HGB 8.5* 11/09/2010   Lab Results  Component Value Date   WBC 8.3 01/03/2011   PLT 459* 01/03/2011   Lab Results  Component Value Date   INR 1.12 10/13/2010   Lab Results  Component Value Date   NA 133* 01/04/2011   K 4.3 01/04/2011   CL 99 01/04/2011   CO2 30 01/04/2011   BUN 12 01/04/2011   CREATININE 0.72 01/04/2011   GLUCOSE 102* 01/04/2011    Assessment/Plan: Principal Problem:  *Osteomyelitis of right foot Active Problems:  Calcaneus fracture, right   Advance diet Up with therapy Continue ABX therapy due to Post-op infection Plan for discharge tomorrow Discharge home with home health  Cam boot, [email or web address] observe.   Jaydynn Wolford P 01/04/2011, 1:35 PM

## 2011-01-04 NOTE — Progress Notes (Signed)
Subjective: No new complaints  Objective: Weight change:   Intake/Output Summary (Last 24 hours) at 01/04/11 1846 Last data filed at 01/04/11 1300  Gross per 24 hour  Intake    480 ml  Output   1900 ml  Net  -1420 ml   Blood pressure 128/71, pulse 88, temperature 98.4 F (36.9 C), temperature source Oral, resp. rate 18, height 5\' 7"  (1.702 m), weight 110 lb (49.896 kg), SpO2 99.00%. Temp:  [97.7 F (36.5 C)-98.4 F (36.9 C)] 98.4 F (36.9 C) (12/20 1434) Pulse Rate:  [85-95] 88  (12/20 1434) Resp:  [18] 18  (12/20 1434) BP: (114-139)/(71-72) 128/71 mmHg (12/20 1434) SpO2:  [93 %-100 %] 99 % (12/20 1434)  Physical Exam: General: Alert and awake, oriented x3, not in any acute distress.  HEENT: anicteric sclera, pupils reactive to light and accommodation, EOMI, oropharynx clear and without exudate  CVS regular rate, normal r, no murmur rubs or gallops  Chest: clear to auscultation bilaterally, no wheezing, rales or rhonchi  Abdomen: soft nontender, nondistended, normal bowel sounds,   Skin: no rashes  Neuro: nonfocal, strength and sensation intact  MS: his left shoulder with clean wound, without purulence or tenderness on exam, right ankle with wound vacuum dressing   Lab Results:  Basename 01/03/11 0517 01/02/11 1358  WBC 8.3 12.3*  HGB 7.9* 9.5*  HCT 27.6* 33.1*  PLT 459* 580*   BMET  Basename 01/04/11 0545 01/03/11 0517  NA 133* 140  K 4.3 4.1  CL 99 103  CO2 30 27  GLUCOSE 102* 101*  BUN 12 20  CREATININE 0.72 0.92  CALCIUM 8.6 8.9    Micro Results: Recent Results (from the past 240 hour(s))  SURGICAL PCR SCREEN     Status: Abnormal   Collection Time   01/02/11  2:24 PM      Component Value Range Status Comment   MRSA, PCR NEGATIVE  NEGATIVE  Final    Staphylococcus aureus POSITIVE (*) NEGATIVE  Final   WOUND CULTURE     Status: Normal (Preliminary result)   Collection Time   01/03/11 12:17 AM      Component Value Range Status Comment   Specimen  Description WOUND   Final    Special Requests RIGHT CALCANEUS   Final    Gram Stain PENDING   Incomplete    Culture RARE GRAM NEGATIVE RODS   Final    Report Status PENDING   Incomplete   ANAEROBIC CULTURE     Status: Normal (Preliminary result)   Collection Time   01/03/11 12:17 AM      Component Value Range Status Comment   Specimen Description WOUND   Final    Special Requests RIGHT CALCANEUS   Final    Gram Stain     Final    Value: ABUNDANT WBC PRESENT,BOTH PMN AND MONONUCLEAR     NO SQUAMOUS EPITHELIAL CELLS SEEN     NO ORGANISMS SEEN   Culture     Final    Value: NO ANAEROBES ISOLATED; CULTURE IN PROGRESS FOR 5 DAYS   Report Status PENDING   Incomplete   TISSUE CULTURE     Status: Normal (Preliminary result)   Collection Time   01/03/11 12:18 AM      Component Value Range Status Comment   Specimen Description TISSUE   Final    Special Requests RIGHT CALCANEUS   Final    Gram Stain     Final    Value: RARE WBC  PRESENT, PREDOMINANTLY PMN     NO ORGANISMS SEEN   Culture NO GROWTH 1 DAY   Final    Report Status PENDING   Incomplete     Studies/Results: Dg Chest 2 View  01/02/2011  *RADIOLOGY REPORT*  Clinical Data: Preoperative respiratory exam.  Right foot infection.  CHEST - 2 VIEW  Comparison: 10/30/2010  Findings: Heart size and vascularity are normal and the lungs are clear of acute infiltrates.  Healing fractures of bilateral ribs. Scarring in the lung apices.  Prosthesis in the proximal left humerus, unchanged.  IMPRESSION: No acute abnormality.  Healing bilateral rib fractures.  Original Report Authenticated By: Gwynn Burly, M.D.    Antibiotics:  Anti-infectives     Start     Dose/Rate Route Frequency Ordered Stop   01/04/11 0000   ceFAZolin (ANCEF) 1-5 GM-%        2 g 200 mL/hr over 30 Minutes Intravenous Every 8 hours 01/04/11 1331 02/15/11 2359   01/04/11 0000   rifampin (RIFADIN) 300 MG capsule        600 mg Oral Daily at bedtime 01/04/11 1331  01/14/11 2359   01/03/11 2200   rifampin (RIFADIN) capsule 600 mg        600 mg Oral Daily at bedtime 01/03/11 0506     01/03/11 0600   ceFAZolin (ANCEF) IVPB 1 g/50 mL premix        1 g 100 mL/hr over 30 Minutes Intravenous 3 times per day 01/03/11 0053 01/31/11 0559   01/02/11 1352   ceFAZolin (ANCEF) IVPB 1 g/50 mL premix  Status:  Discontinued        1 g 100 mL/hr over 30 Minutes Intravenous 60 min pre-op 01/02/11 1352 01/02/11 1933          Medications: Scheduled Meds:   . ceFAZolin (ANCEF) IV  1 g Intravenous Q8H  . citalopram  20 mg Oral Daily  . enoxaparin  40 mg Subcutaneous Q24H  . gabapentin  100 mg Oral TID  . mulitivitamin with minerals  1 tablet Oral Daily  . pantoprazole  40 mg Oral Q12H  . rifampin  600 mg Oral QHS   Continuous Infusions:   . 0.45 % NaCl with KCl 20 mEq / L 75 mL/hr at 01/04/11 1258   PRN Meds:.acetaminophen, acetaminophen, droperidol, HYDROmorphone, HYDROmorphone (DILAUDID) injection, menthol-cetylpyridinium, methocarbamol(ROBAXIN) IV, methocarbamol, metoCLOPramide (REGLAN) injection, metoCLOPramide, ondansetron (ZOFRAN) IV, ondansetron, oxyCODONE, oxyCODONE-acetaminophen, phenol, sodium chloride, zolpidem  Assessment/Plan: [name] is a 50 y.o. 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   1) Calcaneal hardware associated osteomyelitis: he had few GNR seen on gstain from intraop culture, likley same serratia species from before  --agree with ancef (and rifampin not necessary here if hardware out but worry about his shoulder still)  ---would treat with 6 weeks of IV ance at 2 IV q 8 hours  with followup in [facility]   2) Infected prosthetic shoulder: I dont like hearing that he has had drainage from here. Discussed with Dr. Dion Saucier. We will proceed with IV ancef and rifampin and then  try chronic suppressive therapy with oral keflex and possibly oral rifampin as well  3) Screening: HIV negative, hep virus serologies pending   Patient has hospital follow-up with me on 02/07/2011 @ 9am and has my card and I put this information into  the discharge manager.  I will followup culture data but will otherwise sign for now.      LOS: 2 days   Acey Lav 01/04/2011, 6:46 PM

## 2011-01-05 LAB — WOUND CULTURE

## 2011-01-05 MED ORDER — HEPARIN SOD (PORK) LOCK FLUSH 100 UNIT/ML IV SOLN
250.0000 [IU] | Freq: Every day | INTRAVENOUS | Status: DC
  Filled 2011-01-05: qty 3

## 2011-01-05 MED ORDER — HEPARIN SOD (PORK) LOCK FLUSH 100 UNIT/ML IV SOLN
250.0000 [IU] | INTRAVENOUS | Status: DC | PRN
  Administered 2011-01-05: 250 [IU]
  Filled 2011-01-05: qty 3

## 2011-01-05 NOTE — Discharge Summary (Signed)
Physician Discharge Summary  Patient ID: [name] MRN: [id number] DOB/AGE: 1960/12/31 50 y.o.  Admit date: 01/02/2011 Discharge date: 01/05/2011  Admission Diagnoses:  Osteomyelitis of right foot  Discharge Diagnoses:  Principal Problem:  *Osteomyelitis of right foot Active Problems:  Calcaneus fracture, right   Past Medical History  Diagnosis Date  . GI bleed   . Proximal humerus fracture 07/17/2010    with dislocation, repaired 07/17/2010  . Depression   . Substance abuse   . H/O: GI bleed   . Fracture of humerus, proximal, left, open 07/17/2010    repaired 07/21/2010  . Frequent falls     sec. to alcohol abuse  . Pneumonia     20+yrs ago  . Headache   . Dizziness   . Confusion   . Bruise     left shoulder  . Gastric ulcer   . GERD (gastroesophageal reflux disease)     takes Protonix daily  . Kidney stones     hx of  . Anxiety   . Insomnia   . Calcaneus fracture, right 01/04/2011  . Osteomyelitis of right foot 01/04/2011    Surgeries: Procedure(s): IRRIGATION AND DEBRIDEMENT EXTREMITY on 01/02/2011 - 01/03/2011   Consultants (if any):    Discharged Condition: Improved  Hospital Course: [name] is an 49 y.o. male who was admitted 01/02/2011 with a diagnosis of Osteomyelitis of right foot and went to the operating room on 01/02/2011 - 01/03/2011 and underwent the above named procedures.    He was given perioperative antibiotics:  Anti-infectives     Start     Dose/Rate Route Frequency Ordered Stop   01/05/11 0600   ceFAZolin (ANCEF) IVPB 2 g/50 mL premix        2 g 100 mL/hr over 30 Minutes Intravenous 3 times per day 01/04/11 2142 01/31/11 0559   01/04/11 0000   ceFAZolin (ANCEF) 1-5 GM-%        2 g 200 mL/hr over 30 Minutes Intravenous Every 8 hours 01/04/11 1331 02/15/11 2359   01/04/11 0000   rifampin (RIFADIN) 300 MG capsule        600 mg Oral Daily at bedtime 01/04/11 1331 01/14/11 2359   01/03/11 2200   rifampin (RIFADIN) capsule 600 mg         600 mg Oral Daily at bedtime 01/03/11 0506     01/03/11 0600   ceFAZolin (ANCEF) IVPB 1 g/50 mL premix  Status:  Discontinued        1 g 100 mL/hr over 30 Minutes Intravenous 3 times per day 01/03/11 0053 01/04/11 2142   01/02/11 1352   ceFAZolin (ANCEF) IVPB 1 g/50 mL premix  Status:  Discontinued        1 g 100 mL/hr over 30 Minutes Intravenous 60 min pre-op 01/02/11 1352 01/02/11 1933        .  He was given sequential compression devices, early ambulation, and chemoprophylaxis for DVT prophylaxis. He was also given a PICC line, and infectious disease consultation was requested, and Dr. case and am assisted in his care. He recommended rifampin and Ancef. He had a methicillin sensitive staph aureus from previous cultures from this wound, and repeat cultures are currently pending. He had a wound VAC, over a closed incision, in order to minimize drainage. The status of his fracture, and wound, is certainly tenuous, and we will be monitoring closely.  He benefited maximally from their hospital stay and there were no complications.    Recent vital  signs:  Filed Vitals:   01/05/11 0517  BP: 161/92  Pulse: 86  Temp: 97.8 F (36.6 C)  Resp: 18    Recent laboratory studies:  Lab Results  Component Value Date   HGB 7.9* 01/03/2011   HGB 9.5* 01/02/2011   HGB 8.5* 11/09/2010   Lab Results  Component Value Date   WBC 8.3 01/03/2011   PLT 459* 01/03/2011   Lab Results  Component Value Date   INR 1.12 10/13/2010   Lab Results  Component Value Date   NA 133* 01/04/2011   K 4.3 01/04/2011   CL 99 01/04/2011   CO2 30 01/04/2011   BUN 12 01/04/2011   CREATININE 0.72 01/04/2011   GLUCOSE 102* 01/04/2011    Discharge Medications:   Current Discharge Medication List    START taking these medications   Details  ceFAZolin (ANCEF) 1-5 GM-% Inject 100 mLs (2 g total) into the vein every 8 (eight) hours. Dispense quantity sufficient for 6 weeks. Qty: 50 mL, Refills: 0      HYDROmorphone (DILAUDID) 2 MG tablet Take 1 tablet (2 mg total) by mouth every 4 (four) hours as needed for pain. Qty: 50 tablet, Refills: 0    methocarbamol (ROBAXIN) 500 MG tablet Take 1 tablet (500 mg total) by mouth 4 (four) times daily. Qty: 75 tablet, Refills: 1    !! oxyCODONE-acetaminophen (PERCOCET) 10-325 MG per tablet Take 1-2 tablets by mouth every 6 (six) hours as needed for pain. MAXIMUM TOTAL ACETAMINOPHEN DOSE IS 4000 MG PER DAY Qty: 75 tablet, Refills: 0     !! - Potential duplicate medications found. Please discuss with provider.    CONTINUE these medications which have CHANGED   Details  rifampin (RIFADIN) 300 MG capsule Take 2 capsules (600 mg total) by mouth at bedtime. Qty: 60 capsule, Refills: 1      CONTINUE these medications which have NOT CHANGED   Details  citalopram (CELEXA) 20 MG tablet Take 20 mg by mouth daily.      gabapentin (NEURONTIN) 100 MG capsule Take 100 mg by mouth 3 (three) times daily.      Multiple Vitamin (MULTIVITAMIN) tablet Take 1 tablet by mouth daily.      !! oxyCODONE-acetaminophen (PERCOCET) 10-325 MG per tablet Take 1-2 tablets by mouth every 4 (four) hours as needed. For pain     pantoprazole (PROTONIX) 40 MG tablet Take 40 mg by mouth every 12 (twelve) hours.       !! - Potential duplicate medications found. Please discuss with provider.    STOP taking these medications     acetaminophen (TYLENOL) 500 MG tablet         Diagnostic Studies: Dg Chest 2 View  01/02/2011  *RADIOLOGY REPORT*  Clinical Data: Preoperative respiratory exam.  Right foot infection.  CHEST - 2 VIEW  Comparison: 10/30/2010  Findings: Heart size and vascularity are normal and the lungs are clear of acute infiltrates.  Healing fractures of bilateral ribs. Scarring in the lung apices.  Prosthesis in the proximal left humerus, unchanged.  IMPRESSION: No acute abnormality.  Healing bilateral rib fractures.  Original Report Authenticated By: Gwynn Burly, M.D.    Disposition: Home or Self Care  Discharge Orders    Future Appointments: Provider: Department: Dept Phone: Center:   02/07/2011 9:00 AM Acey Lav, MD [facility] [phone] [facility]     Future Orders Please Complete By Expires   Diet general      Call MD /  Call 911      Comments:   If you experience chest pain or shortness of breath, CALL 911 and be transported to the hospital emergency room.  If you develope a fever above 101 F, pus (white drainage) or increased drainage or redness at the wound, or calf pain, call your surgeon's office.   Constipation Prevention      Comments:   Drink plenty of fluids.  Prune juice may be helpful.  You may use a stool softener, such as Colace (over the counter) 100 mg twice a day.  Use MiraLax (over the counter) for constipation as needed.   Increase activity slowly as tolerated      Weight Bearing as taught in Physical Therapy      Comments:   Use a walker or crutches as instructed.   Do not sit on low chairs, stoools or toilet seats, as it may be difficult to get up from low surfaces      Discharge wound care:      Comments:   If you have a hip bandage, keep it clean and dry.  Change your bandage as instructed by your health care providers.  If your bandage has been discontinued, keep your incision clean and dry.  Pat dry after bathing.  DO NOT put lotion or powder on your incision.   Discharge instructions      Comments:   Keep wound clean and dry.      Follow-up Information    Follow up with Acey Lav, MD on 02/07/2011. (at 9 am)    Contact information:   [address] [place] [address] [phone]       Follow up with Heston Widener P in 1 week.   Contact information:   [facility] [address] [place] [address] [phone]           Signed: Eulas Post 01/05/2011, 9:24 AM

## 2011-01-05 NOTE — Progress Notes (Signed)
Utilization review completed.  

## 2011-01-05 NOTE — Progress Notes (Signed)
Subjective: 3 Days Post-Op Procedure(s) (LRB): IRRIGATION AND DEBRIDEMENT EXTREMITY (Right) Patient reports pain as moderate and severe.    Objective: Vital signs in last 24 hours: Temp:  [97.8 F (36.6 C)-98.4 F (36.9 C)] 97.8 F (36.6 C) (12/21 0517) Pulse Rate:  [86-88] 86  (12/21 0517) Resp:  [18] 18  (12/21 0517) BP: (128-161)/(71-92) 161/92 mmHg (12/21 0517) SpO2:  [93 %-99 %] 98 % (12/21 0517)  Intake/Output from previous day: 12/20 0701 - 12/21 0700 In: 3757.5 [P.O.:480; I.V.:3277.5] Out: 1400 [Urine:1400] Intake/Output this shift:     Basename 01/03/11 0517 01/02/11 1358  HGB 7.9* 9.5*    Basename 01/03/11 0517 01/02/11 1358  WBC 8.3 12.3*  RBC 3.51* 4.20*  HCT 27.6* 33.1*  PLT 459* 580*    Basename 01/04/11 0545 01/03/11 0517  NA 133* 140  K 4.3 4.1  CL 99 103  CO2 30 27  BUN 12 20  CREATININE 0.72 0.92  GLUCOSE 102* 101*  CALCIUM 8.6 8.9   No results found for this basename: LABPT:2,INR:2 in the last 72 hours  Sensation intact distally Dorsiflexion/Plantar flexion intact Incision: moderate drainage Wound vac removed and dry dressing placed. Wound well approximated  Assessment/Plan: 3 Days Post-Op Procedure(s) (LRB): IRRIGATION AND DEBRIDEMENT EXTREMITY (Right) Advance diet Up with therapy Discharge home with home health  Haskel Khan 01/05/2011, 9:10 AM

## 2011-01-05 NOTE — Progress Notes (Signed)
CARE MANAGEMENT NOTE 01/05/2011 Discharge planning. Pt has PICC for IV ancef. Needs to stay for 2p dose but refusing, stating he has a ride lined up and wont have one later. Discussed need. RN to notify MD.pt will miss a dose today. [facility] will see him in the evening.

## 2011-01-06 ENCOUNTER — Telehealth: Payer: Self-pay | Admitting: Infectious Disease

## 2011-01-06 LAB — TISSUE CULTURE: Culture: NO GROWTH

## 2011-01-06 MED ORDER — DEXTROSE 5 % IV SOLN: 2.0000 g | Freq: Three times a day (TID) | INTRAVENOUS | Status: DC

## 2011-01-06 MED ORDER — CIPROFLOXACIN HCL 750 MG PO TABS: 750.0000 mg | ORAL_TABLET | Freq: Two times a day (BID) | ORAL | Status: DC

## 2011-01-06 MED ORDER — RIFAMPIN 300 MG PO CAPS: 600.0000 mg | ORAL_CAPSULE | Freq: Every day | ORAL | Status: AC

## 2011-01-06 NOTE — Telephone Encounter (Signed)
I have changed my mind and would prefer for him to get cefepime 2g iv q 8 hours and rifampin with 6 week duration followed by an oral regimen. He can use the cipro until [facility] make sthe change (already called them)

## 2011-01-06 NOTE — Telephone Encounter (Signed)
Patient grew pseudomonas from his calcaneal wound. I am calling in cipro to his pharmacy and called the pt. The cipro was actually active vs the serratia and the staph aureus he grew before. However I would only feel comfortable having him rx the mssa with cipro IF he was getting rifampin along with it consistently. If he can do this we can pull the PICC line and continue with cipro and rifampin  [facility]

## 2011-01-08 LAB — ANAEROBIC CULTURE

## 2011-01-29 ENCOUNTER — Encounter: Payer: Self-pay | Admitting: Infectious Disease

## 2011-02-07 ENCOUNTER — Inpatient Hospital Stay: Payer: Medicaid Other | Admitting: Infectious Disease

## 2012-09-05 IMAGING — CT CT CERVICAL SPINE W/O CM
4 of 6 series · 16 of 33 positions shown, 18 images · non-contrast
Comparison: None.

CT HEAD

CLINICAL DATA: Intoxication.  Fall.

CT HEAD WITHOUT CONTRAST
CT CERVICAL SPINE WITHOUT CONTRAST
TECHNIQUE: Multidetector CT imaging of the head and cervical spine
was performed following the standard protocol without intravenous
contrast.  Multiplanar CT image reconstructions of the cervical
spine were also generated.

[Series 5: soft tissue · axial · 0.35mm/px · z∈[+48,+176]mm · 5 of 96 slices shown]
[im 16/96  soft-tissue]
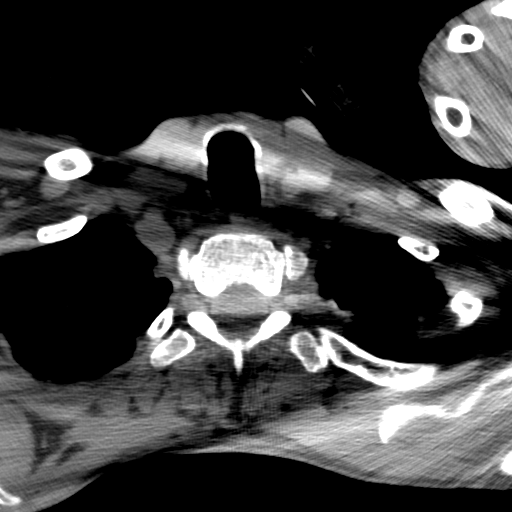
[im 32/96  soft-tissue]
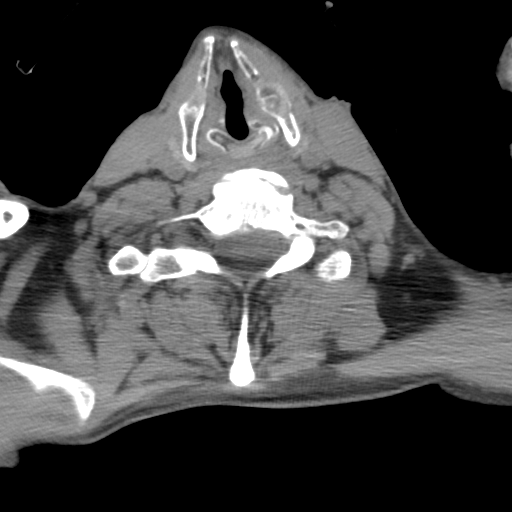
[im 48/96  soft-tissue]
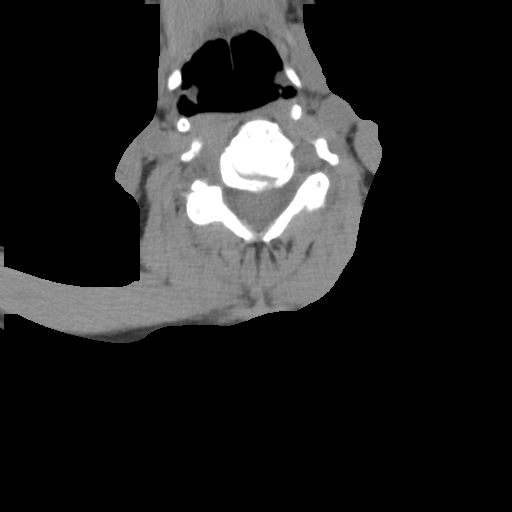
[im 64/96  soft-tissue]
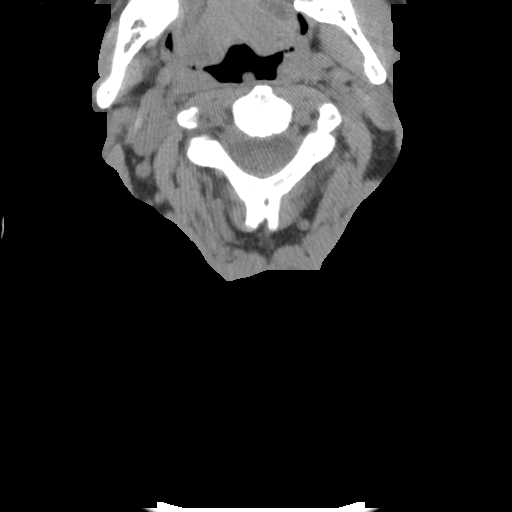
[im 80/96  soft-tissue]
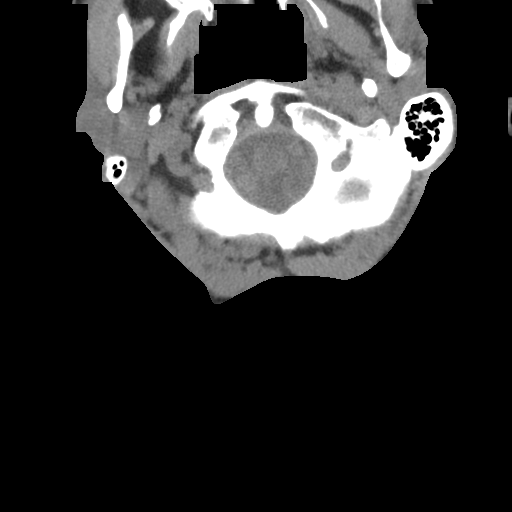

[sag · sagittal · 0.37mm/px · 5 of 52 slices shown, 6 images]
[im 18/52  bone]
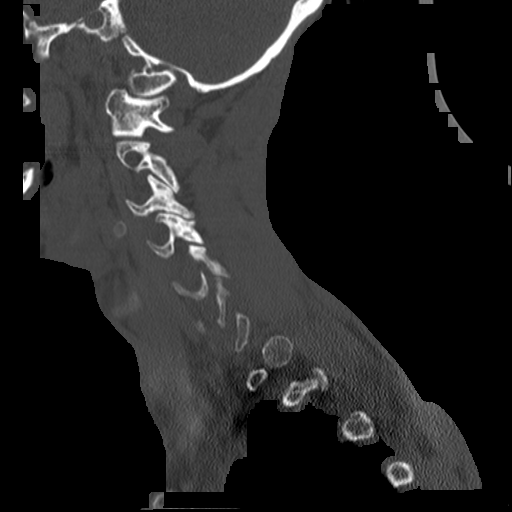
[im 22/52  bone]
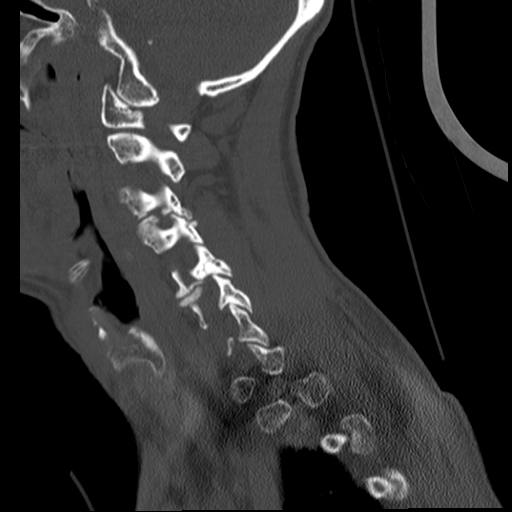
[im 26/52  soft-tissue]
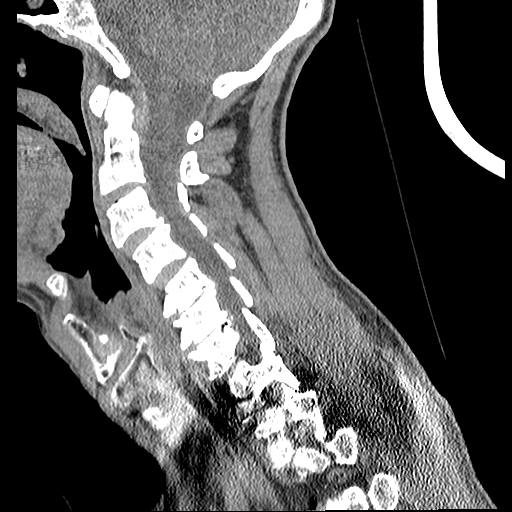
[im 26/52  bone]
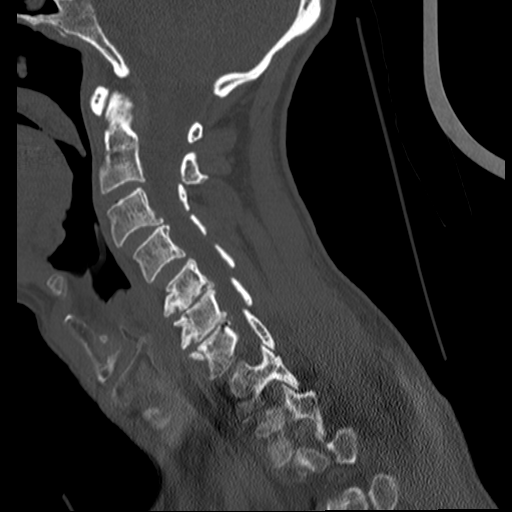
[im 30/52  bone]
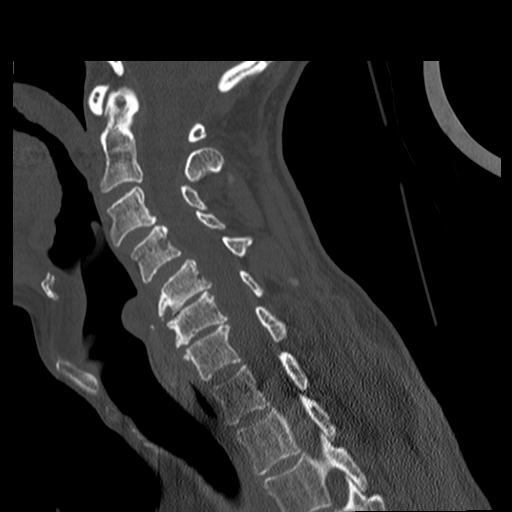
[im 35/52  bone]
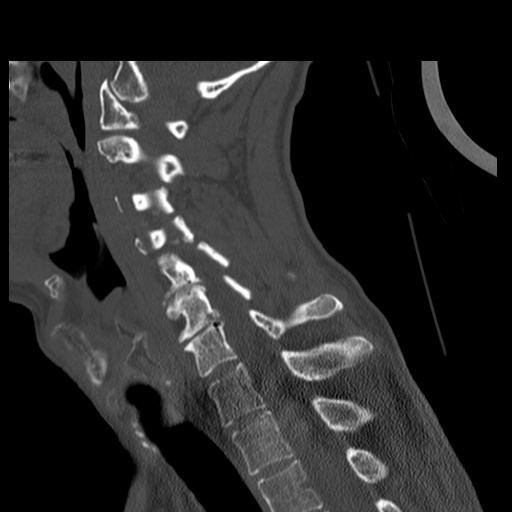

[cor · coronal · 0.37mm/px · 3 of 67 slices shown]
[im 14/67  bone]
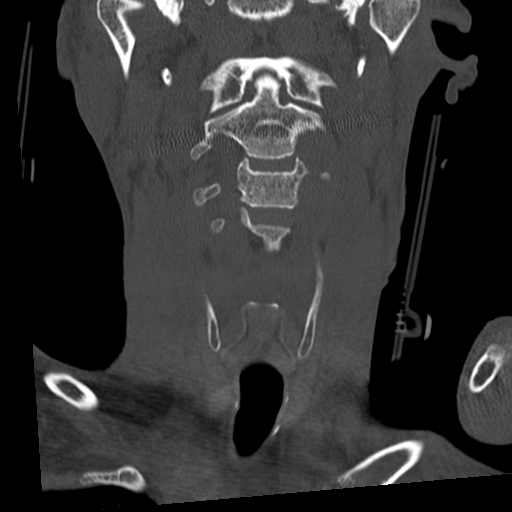
[im 27/67  bone]
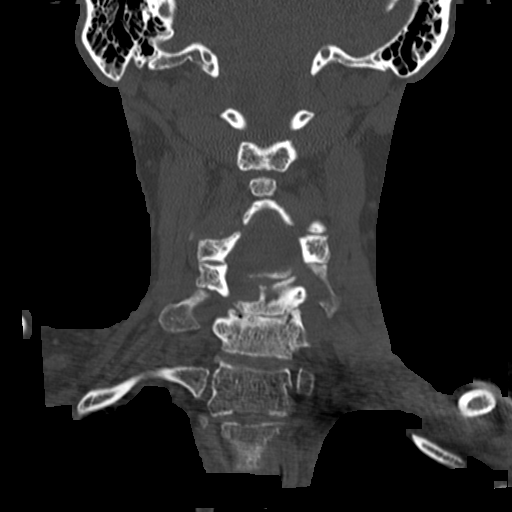
[im 40/67  bone]
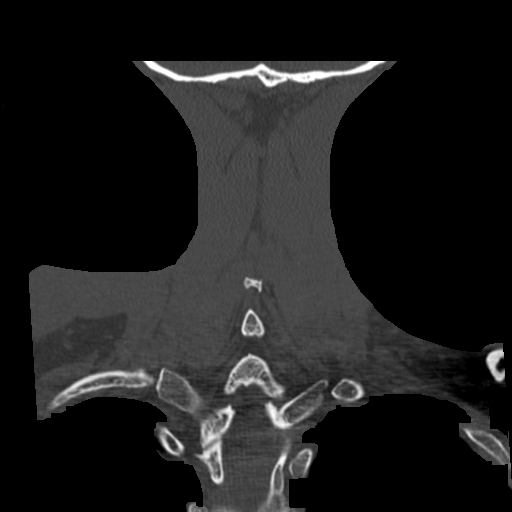

[(id) · axial · 0.37mm/px · z∈[+28,+104]mm · 3 of 45 slices shown, 4 images]
[im 1/45  soft-tissue]
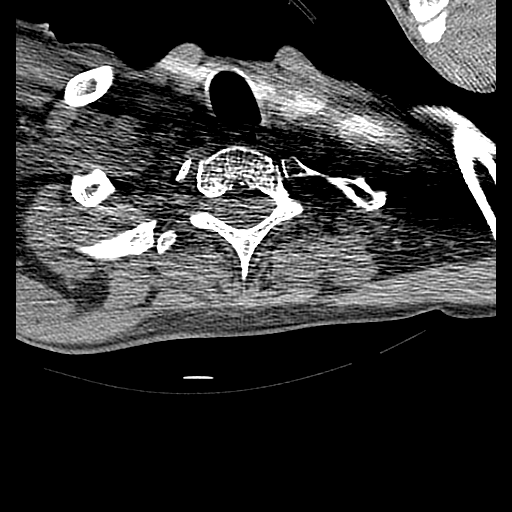
[im 1/45  bone]
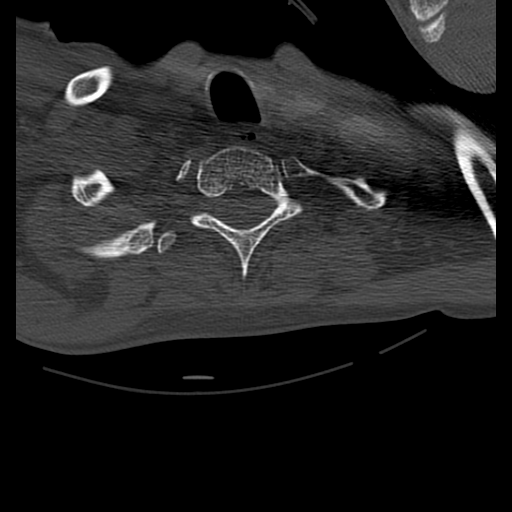
[im 23/45  bone]
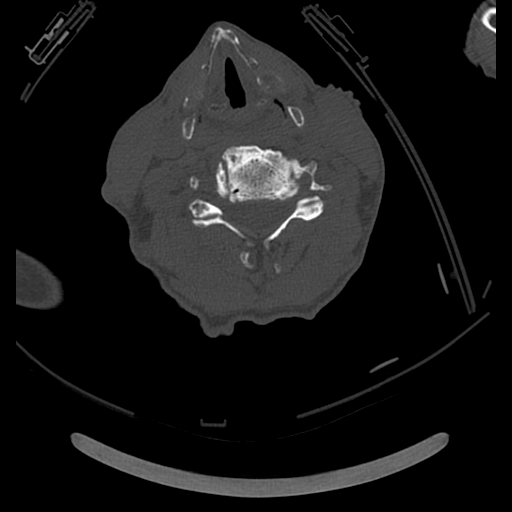
[im 45/45  bone]
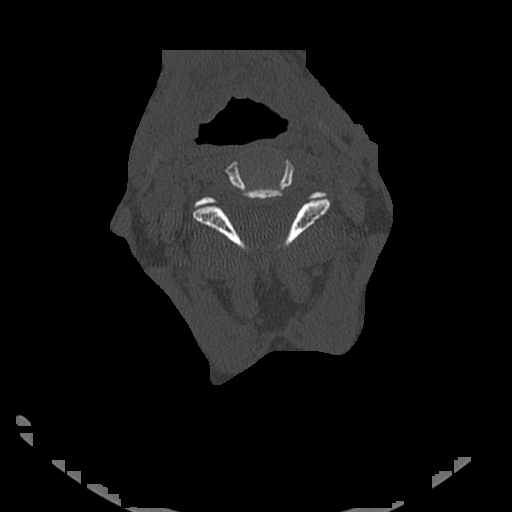

[16 of 33 positions shown; findings below may reference images not displayed]

FINDINGS: There is no evidence of acute intracranial hemorrhage,
mass lesion, brain edema or extra-axial fluid collection.  The
ventricles and subarachnoid spaces are appropriately sized for age.
There is no CT evidence of acute cortical infarction.  There is a
probable prominent perivascular space in the right lentiform
nucleus.

The visualized paranasal sinuses are clear. The calvarium is
intact.
IMPRESSION: No acute intracranial or calvarial findings.

CT CERVICAL SPINE
FINDINGS: The cervical alignment is anatomic.  There is a mild
degenerative anterolisthesis of C4 on C5.  There is advanced disc
space loss at C5-C6 and C6-C7 with associated uncinate spurring.
There is no evidence of acute fracture or traumatic subluxation.
No acute soft tissue findings are identified.
IMPRESSION: 1.  No evidence of acute cervical spine fracture, traumatic
subluxation or static signs of instability.
2.  Moderate spondylosis.

## 2020-12-01 ENCOUNTER — Emergency Department (HOSPITAL_COMMUNITY): Payer: Medicare Other

## 2020-12-01 ENCOUNTER — Inpatient Hospital Stay (HOSPITAL_COMMUNITY)
Admission: EM | Admit: 2020-12-01 | Discharge: 2020-12-15 | DRG: 436 | Disposition: E | Payer: Medicare Other | Attending: Internal Medicine | Admitting: Internal Medicine

## 2020-12-01 DIAGNOSIS — F1010 Alcohol abuse, uncomplicated: Secondary | ICD-10-CM | POA: Diagnosis present

## 2020-12-01 DIAGNOSIS — K219 Gastro-esophageal reflux disease without esophagitis: Secondary | ICD-10-CM | POA: Diagnosis present

## 2020-12-01 DIAGNOSIS — Z789 Other specified health status: Secondary | ICD-10-CM

## 2020-12-01 DIAGNOSIS — R112 Nausea with vomiting, unspecified: Secondary | ICD-10-CM

## 2020-12-01 DIAGNOSIS — Z8711 Personal history of peptic ulcer disease: Secondary | ICD-10-CM

## 2020-12-01 DIAGNOSIS — R188 Other ascites: Secondary | ICD-10-CM | POA: Diagnosis present

## 2020-12-01 DIAGNOSIS — Z20822 Contact with and (suspected) exposure to covid-19: Secondary | ICD-10-CM | POA: Diagnosis present

## 2020-12-01 DIAGNOSIS — G4700 Insomnia, unspecified: Secondary | ICD-10-CM | POA: Diagnosis present

## 2020-12-01 DIAGNOSIS — C7802 Secondary malignant neoplasm of left lung: Secondary | ICD-10-CM | POA: Diagnosis present

## 2020-12-01 DIAGNOSIS — C787 Secondary malignant neoplasm of liver and intrahepatic bile duct: Principal | ICD-10-CM | POA: Diagnosis present

## 2020-12-01 DIAGNOSIS — I700 Atherosclerosis of aorta: Secondary | ICD-10-CM | POA: Diagnosis present

## 2020-12-01 DIAGNOSIS — F419 Anxiety disorder, unspecified: Secondary | ICD-10-CM | POA: Diagnosis present

## 2020-12-01 DIAGNOSIS — G893 Neoplasm related pain (acute) (chronic): Secondary | ICD-10-CM | POA: Diagnosis present

## 2020-12-01 DIAGNOSIS — Z515 Encounter for palliative care: Secondary | ICD-10-CM

## 2020-12-01 DIAGNOSIS — C786 Secondary malignant neoplasm of retroperitoneum and peritoneum: Secondary | ICD-10-CM | POA: Diagnosis present

## 2020-12-01 DIAGNOSIS — C799 Secondary malignant neoplasm of unspecified site: Secondary | ICD-10-CM | POA: Diagnosis present

## 2020-12-01 DIAGNOSIS — Z66 Do not resuscitate: Secondary | ICD-10-CM | POA: Diagnosis present

## 2020-12-01 DIAGNOSIS — F17210 Nicotine dependence, cigarettes, uncomplicated: Secondary | ICD-10-CM | POA: Diagnosis present

## 2020-12-01 DIAGNOSIS — C801 Malignant (primary) neoplasm, unspecified: Secondary | ICD-10-CM | POA: Diagnosis present

## 2020-12-01 DIAGNOSIS — R64 Cachexia: Secondary | ICD-10-CM | POA: Diagnosis present

## 2020-12-01 DIAGNOSIS — J439 Emphysema, unspecified: Secondary | ICD-10-CM | POA: Diagnosis present

## 2020-12-01 DIAGNOSIS — Z7189 Other specified counseling: Secondary | ICD-10-CM

## 2020-12-01 DIAGNOSIS — F32A Depression, unspecified: Secondary | ICD-10-CM | POA: Diagnosis present

## 2020-12-01 DIAGNOSIS — Z681 Body mass index (BMI) 19 or less, adult: Secondary | ICD-10-CM

## 2020-12-01 DIAGNOSIS — Z79899 Other long term (current) drug therapy: Secondary | ICD-10-CM

## 2020-12-01 DIAGNOSIS — Z87442 Personal history of urinary calculi: Secondary | ICD-10-CM

## 2020-12-01 DIAGNOSIS — R54 Age-related physical debility: Secondary | ICD-10-CM | POA: Diagnosis present

## 2020-12-01 DIAGNOSIS — R52 Pain, unspecified: Secondary | ICD-10-CM

## 2020-12-01 DIAGNOSIS — R109 Unspecified abdominal pain: Secondary | ICD-10-CM | POA: Diagnosis not present

## 2020-12-01 DIAGNOSIS — Z79891 Long term (current) use of opiate analgesic: Secondary | ICD-10-CM

## 2020-12-01 DIAGNOSIS — C7801 Secondary malignant neoplasm of right lung: Secondary | ICD-10-CM | POA: Diagnosis present

## 2020-12-01 LAB — URINALYSIS, ROUTINE W REFLEX MICROSCOPIC
Bilirubin Urine: NEGATIVE
Glucose, UA: NEGATIVE mg/dL
Hgb urine dipstick: NEGATIVE
Ketones, ur: NEGATIVE mg/dL
Leukocytes,Ua: NEGATIVE
Nitrite: NEGATIVE
Protein, ur: NEGATIVE mg/dL
Specific Gravity, Urine: 1.01 (ref 1.005–1.030)
pH: 5 (ref 5.0–8.0)

## 2020-12-01 LAB — CBC WITH DIFFERENTIAL/PLATELET
Abs Immature Granulocytes: 0.14 10*3/uL — ABNORMAL HIGH (ref 0.00–0.07)
Basophils Absolute: 0 10*3/uL (ref 0.0–0.1)
Basophils Relative: 0 %
Eosinophils Absolute: 0 10*3/uL (ref 0.0–0.5)
Eosinophils Relative: 0 %
HCT: 42.7 % (ref 39.0–52.0)
Hemoglobin: 13.6 g/dL (ref 13.0–17.0)
Immature Granulocytes: 1 %
Lymphocytes Relative: 2 %
Lymphs Abs: 0.5 10*3/uL — ABNORMAL LOW (ref 0.7–4.0)
MCH: 31.5 pg (ref 26.0–34.0)
MCHC: 31.9 g/dL (ref 30.0–36.0)
MCV: 98.8 fL (ref 80.0–100.0)
Monocytes Absolute: 1.3 10*3/uL — ABNORMAL HIGH (ref 0.1–1.0)
Monocytes Relative: 5 %
Neutro Abs: 22.6 10*3/uL — ABNORMAL HIGH (ref 1.7–7.7)
Neutrophils Relative %: 92 %
Platelets: 231 10*3/uL (ref 150–400)
RBC: 4.32 MIL/uL (ref 4.22–5.81)
RDW: 18.2 % — ABNORMAL HIGH (ref 11.5–15.5)
WBC: 24.6 10*3/uL — ABNORMAL HIGH (ref 4.0–10.5)
nRBC: 0 % (ref 0.0–0.2)

## 2020-12-01 LAB — COMPREHENSIVE METABOLIC PANEL
ALT: 42 U/L (ref 0–44)
AST: 94 U/L — ABNORMAL HIGH (ref 15–41)
Albumin: 2.7 g/dL — ABNORMAL LOW (ref 3.5–5.0)
Alkaline Phosphatase: 263 U/L — ABNORMAL HIGH (ref 38–126)
Anion gap: 12 (ref 5–15)
BUN: 46 mg/dL — ABNORMAL HIGH (ref 6–20)
CO2: 29 mmol/L (ref 22–32)
Calcium: 11.3 mg/dL — ABNORMAL HIGH (ref 8.9–10.3)
Chloride: 97 mmol/L — ABNORMAL LOW (ref 98–111)
Creatinine, Ser: 0.99 mg/dL (ref 0.61–1.24)
GFR, Estimated: >60 mL/min (ref >60)
Glucose, Bld: 112 mg/dL — ABNORMAL HIGH (ref 70–99)
Potassium: 3.6 mmol/L (ref 3.5–5.1)
Sodium: 138 mmol/L (ref 135–145)
Total Bilirubin: 1.7 mg/dL — ABNORMAL HIGH (ref 0.3–1.2)
Total Protein: 6.5 g/dL (ref 6.5–8.1)

## 2020-12-01 LAB — RESP PANEL BY RT-PCR (FLU A&B, COVID) ARPGX2
Influenza A by PCR: NEGATIVE
Influenza B by PCR: NEGATIVE
SARS Coronavirus 2 by RT PCR: NEGATIVE

## 2020-12-01 LAB — LACTIC ACID, PLASMA
Lactic Acid, Venous: 1.2 mmol/L (ref 0.5–1.9)
Lactic Acid, Venous: 1.2 mmol/L (ref 0.5–1.9)

## 2020-12-01 LAB — AMMONIA: Ammonia: 30 umol/L (ref 9–35)

## 2020-12-01 LAB — PROTIME-INR
INR: 1.1 (ref 0.8–1.2)
Prothrombin Time: 13.7 seconds (ref 11.4–15.2)

## 2020-12-01 LAB — LIPASE, BLOOD: Lipase: 20 U/L (ref 11–51)

## 2020-12-01 MED ORDER — IOHEXOL 350 MG/ML SOLN
100.0000 mL | Freq: Once | INTRAVENOUS | Status: AC | PRN
  Administered 2020-12-01: 07:00:00 100 mL via INTRAVENOUS

## 2020-12-01 MED ORDER — GLYCOPYRROLATE 0.2 MG/ML IJ SOLN
0.2000 mg | INTRAMUSCULAR | Status: DC | PRN
  Filled 2020-12-01: qty 1

## 2020-12-01 MED ORDER — SODIUM CHLORIDE 0.9 % IV BOLUS
1000.0000 mL | Freq: Once | INTRAVENOUS | Status: AC
  Administered 2020-12-01: 05:00:00 1000 mL via INTRAVENOUS

## 2020-12-01 MED ORDER — ONDANSETRON HCL 4 MG/2ML IJ SOLN
4.0000 mg | Freq: Four times a day (QID) | INTRAMUSCULAR | Status: DC | PRN
  Administered 2020-12-01 – 2020-12-02 (×2): 4 mg via INTRAVENOUS
  Filled 2020-12-01 (×2): qty 2

## 2020-12-01 MED ORDER — HALOPERIDOL LACTATE 5 MG/ML IJ SOLN
2.0000 mg | Freq: Four times a day (QID) | INTRAMUSCULAR | Status: DC | PRN
  Administered 2020-12-01: 15:00:00 2 mg via INTRAVENOUS
  Filled 2020-12-01: qty 1

## 2020-12-01 MED ORDER — ONDANSETRON HCL 4 MG/2ML IJ SOLN
4.0000 mg | Freq: Once | INTRAMUSCULAR | Status: AC
  Administered 2020-12-01: 10:00:00 4 mg via INTRAVENOUS
  Filled 2020-12-01: qty 2

## 2020-12-01 MED ORDER — HALOPERIDOL LACTATE 2 MG/ML PO CONC
2.0000 mg | Freq: Four times a day (QID) | ORAL | Status: DC | PRN
  Filled 2020-12-01: qty 1

## 2020-12-01 MED ORDER — OXYCODONE-ACETAMINOPHEN 5-325 MG PO TABS
1.0000 | ORAL_TABLET | Freq: Four times a day (QID) | ORAL | Status: DC
  Administered 2020-12-01: 12:00:00 1 via ORAL
  Filled 2020-12-01: qty 1

## 2020-12-01 MED ORDER — LORAZEPAM 2 MG/ML IJ SOLN
1.0000 mg | INTRAMUSCULAR | Status: DC | PRN
  Administered 2020-12-01: 15:00:00 1 mg via INTRAVENOUS
  Filled 2020-12-01: qty 1

## 2020-12-01 MED ORDER — ONDANSETRON HCL 4 MG/2ML IJ SOLN
4.0000 mg | Freq: Once | INTRAMUSCULAR | Status: AC
  Administered 2020-12-01: 05:00:00 4 mg via INTRAVENOUS
  Filled 2020-12-01: qty 2

## 2020-12-01 MED ORDER — ACETAMINOPHEN 325 MG PO TABS: 650.0000 mg | ORAL_TABLET | Freq: Four times a day (QID) | ORAL | Status: DC | PRN

## 2020-12-01 MED ORDER — CITALOPRAM HYDROBROMIDE 20 MG PO TABS
20.0000 mg | ORAL_TABLET | Freq: Every day | ORAL | Status: DC
  Administered 2020-12-01 – 2020-12-02 (×2): 20 mg via ORAL
  Filled 2020-12-01: qty 2
  Filled 2020-12-01: qty 1

## 2020-12-01 MED ORDER — BIOTENE DRY MOUTH MT LIQD
15.0000 mL | Freq: Two times a day (BID) | OROMUCOSAL | Status: DC
  Administered 2020-12-02 (×2): 15 mL via TOPICAL

## 2020-12-01 MED ORDER — LACTATED RINGERS IV SOLN: INTRAVENOUS | Status: DC

## 2020-12-01 MED ORDER — HYDROMORPHONE HCL 1 MG/ML IJ SOLN
1.0000 mg | Freq: Once | INTRAMUSCULAR | Status: AC
  Administered 2020-12-01: 10:00:00 1 mg via INTRAVENOUS
  Filled 2020-12-01: qty 1

## 2020-12-01 MED ORDER — ENOXAPARIN SODIUM 40 MG/0.4ML IJ SOSY
40.0000 mg | PREFILLED_SYRINGE | INTRAMUSCULAR | Status: DC
  Administered 2020-12-01: 15:00:00 40 mg via SUBCUTANEOUS
  Filled 2020-12-01: qty 0.4

## 2020-12-01 MED ORDER — ONDANSETRON HCL 4 MG PO TABS: 4.0000 mg | ORAL_TABLET | Freq: Four times a day (QID) | ORAL | Status: DC | PRN

## 2020-12-01 MED ORDER — GLYCOPYRROLATE 1 MG PO TABS
1.0000 mg | ORAL_TABLET | ORAL | Status: DC | PRN
  Filled 2020-12-01: qty 1

## 2020-12-01 MED ORDER — HYDROMORPHONE HCL 1 MG/ML IJ SOLN
1.0000 mg | Freq: Once | INTRAMUSCULAR | Status: AC
  Administered 2020-12-01: 05:00:00 1 mg via INTRAVENOUS
  Filled 2020-12-01: qty 1

## 2020-12-01 MED ORDER — HYDROMORPHONE HCL 1 MG/ML IJ SOLN: 1.0000 mg | INTRAMUSCULAR | Status: DC | PRN

## 2020-12-01 MED ORDER — ADULT MULTIVITAMIN W/MINERALS CH: 1.0000 | ORAL_TABLET | Freq: Every day | ORAL | Status: DC

## 2020-12-01 MED ORDER — LORAZEPAM 1 MG PO TABS
1.0000 mg | ORAL_TABLET | ORAL | Status: DC | PRN
  Administered 2020-12-02: 1 mg via ORAL
  Filled 2020-12-01: qty 1

## 2020-12-01 MED ORDER — LORAZEPAM 2 MG/ML PO CONC: 1.0000 mg | ORAL | Status: DC | PRN

## 2020-12-01 MED ORDER — HYDROMORPHONE HCL 1 MG/ML IJ SOLN
1.0000 mg | INTRAMUSCULAR | Status: DC | PRN
Start: 2020-12-01 — End: 2020-12-02
  Administered 2020-12-01: 2 mg via INTRAVENOUS
  Administered 2020-12-01: 15:00:00 1 mg via INTRAVENOUS
  Administered 2020-12-01 – 2020-12-02 (×5): 2 mg via INTRAVENOUS
  Filled 2020-12-01 (×3): qty 2
  Filled 2020-12-01: qty 1
  Filled 2020-12-01 (×4): qty 2

## 2020-12-01 MED ORDER — ONDANSETRON 4 MG PO TBDP: 4.0000 mg | ORAL_TABLET | Freq: Four times a day (QID) | ORAL | Status: DC | PRN

## 2020-12-01 MED ORDER — HALOPERIDOL 1 MG PO TABS
2.0000 mg | ORAL_TABLET | Freq: Four times a day (QID) | ORAL | Status: DC | PRN
  Filled 2020-12-01: qty 2

## 2020-12-01 MED ORDER — HYDROMORPHONE HCL 1 MG/ML IJ SOLN
1.0000 mg | Freq: Once | INTRAMUSCULAR | Status: AC
  Administered 2020-12-01: 08:00:00 1 mg via INTRAVENOUS
  Filled 2020-12-01: qty 1

## 2020-12-01 MED ORDER — ACETAMINOPHEN 650 MG RE SUPP: 650.0000 mg | Freq: Four times a day (QID) | RECTAL | Status: DC | PRN

## 2020-12-01 MED ORDER — ONDANSETRON HCL 4 MG/2ML IJ SOLN: 4.0000 mg | Freq: Four times a day (QID) | INTRAMUSCULAR | Status: DC | PRN

## 2020-12-01 MED ORDER — GABAPENTIN 100 MG PO CAPS
100.0000 mg | ORAL_CAPSULE | Freq: Three times a day (TID) | ORAL | Status: DC
  Administered 2020-12-01 – 2020-12-02 (×3): 100 mg via ORAL
  Filled 2020-12-01 (×5): qty 1

## 2020-12-01 MED ORDER — POLYVINYL ALCOHOL 1.4 % OP SOLN
1.0000 [drp] | Freq: Four times a day (QID) | OPHTHALMIC | Status: DC | PRN
  Filled 2020-12-01: qty 15

## 2020-12-01 MED ORDER — SENNOSIDES-DOCUSATE SODIUM 8.6-50 MG PO TABS: 1.0000 | ORAL_TABLET | Freq: Every evening | ORAL | Status: DC | PRN

## 2020-12-01 MED ORDER — BUSPIRONE HCL 10 MG PO TABS
10.0000 mg | ORAL_TABLET | Freq: Two times a day (BID) | ORAL | Status: DC
  Administered 2020-12-01 – 2020-12-02 (×3): 10 mg via ORAL
  Filled 2020-12-01 (×4): qty 1

## 2020-12-01 MED ORDER — NALOXONE HCL 0.4 MG/ML IJ SOLN: 0.2000 mg | INTRAMUSCULAR | Status: DC | PRN

## 2020-12-01 MED ORDER — HYDROMORPHONE HCL 1 MG/ML IJ SOLN: 1.0000 mg | Freq: Three times a day (TID) | INTRAMUSCULAR | Status: DC | PRN

## 2020-12-01 NOTE — ED Notes (Signed)
Hosp bed requested at this time for pt

## 2020-12-01 NOTE — ED Provider Notes (Signed)
  Provider Note MRN:  [id number]  Arrival date & time: 11/24/2020    ED Course and Medical Decision Making  Assumed care from Dr Wyvonnia Dusky at shift change.  See not from prior team for complete details, in brief: hx abdominal cancer with mets per pt, records not avail. Supposed to be on decadron but pt not sure. WBC >20. Afebrile, Imaging pending. [facility] records requested but don't have access. Per pt her went to [place] yesterday, discharged home to f/u with hospice per pt, pt reports he is not on hospice.   Plan per prior physician Likely admit for palliative   Imaging resulted, consistent with diffuse metastatic disease, large bulky liver mass with multiple lesions in abdomen, lung nodules. No PE. Pain remains poorly controlled with multiple doses of IV dilaudid, antiemetics. At this time recommend admission for palliative evaluation, intractable pain likely 2/2 diffuse metastatic disease.  D/w hospitalist who accepts pt for admit. Palliative consulted   Procedures  Final Clinical Impressions(s) / ED Diagnoses     ICD-10-CM   1. Intractable abdominal pain  R10.9     2. Intractable nausea and vomiting  R11.2     3. Metastatic malignant neoplasm, unspecified site [facility])  C79.9       ED Discharge Orders     None       Discharge Instructions   None        Jeanell Sparrow, DO 12/02/2020 1540

## 2020-12-01 NOTE — ED Notes (Signed)
Received verbal report from Crystal B RN at this time 
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
## 2020-12-01 NOTE — Discharge Planning (Signed)
RNCM placed referral to  [place] at family's request to be closer to [facility].

## 2020-12-01 NOTE — ED Triage Notes (Signed)
BIBEMS from home, Liver and lung cancer, end stage. C/o generalized body aches, nausea and vomiting. Hx of ETOH and opioid abuse per EMS. Patient was just seen and D/C from [facility]. Few hours prior for same. Hospice to see in the AM per EMS.  Patient EMS: BP: 96/66 HR: 90 SPO2: 91% RA

## 2020-12-01 NOTE — ED Notes (Signed)
Pt continually calling out for dilaudid. MD aware.

## 2020-12-01 NOTE — H&P (Signed)
Date: 12/07/2020               Patient Name:  [name] MRN: [id number]  DOB: [birth year] Age / Sex: 60 y.o., male   PCP: Pcp, No         Medical Service: Internal Medicine Teaching Service         Attending Physician: Dr. Velna Ochs, MD    First Contact: Dr. Alvie Heidelberg Pager: [phone]  Second Contact: Dr. Alfonse Spruce Pager: [phone]       After Hours (After 5p/  First Contact Pager: [phone]  weekends / holidays): Second Contact Pager: [phone]   Chief Complaint: Pain  History of Present Illness: Mr. Steven Chavira. Wells is a 41 yoM with a history of metastatic cancer, anxiety and depression presenting with intractable pain secondary to his metastatic disease.  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  He was told that there was no cure and that he did not have more than a few weeks to live.  He did not receive any chemotherapy or radiation.  He went to [facility] yesterday due to severe pain, nausea and vomiting.  States that they did not do anything for him and he was discharged home.  He has run out of his pain medications.  He is complaining of diffuse pain, most prominent in his right upper quadrant.  He states that he has been vomiting daily and constantly feels nauseated.  Denies any fevers, chills, shortness of breath or chest pain.  States that he lives at home with his son who is usually gone most of the day for work.  He struggles to complete his ADLs.  He has a right lower extremity prosthesis and is unable to ambulate even with the prosthetic device due to significant weakness.  States that he has been "bad shape".   Meds:  Buspar 10 mg BID  Citalopram 20 mg QD Decadron 4 mg as needed Lasix 20 mg QD Gabapentin 100 mg TID Zofran 4 mg as needed Oxycodone-acetaminophen 10-325 mg 1-2 tablets q4h PRN Pantoprazole 40 mg BID    No outpatient medications have been marked as taking for the  11/25/2020 encounter [facility] Encounter).     Allergies: Allergies as of 11/16/2020   (No Known Allergies)   Past Medical History:  Diagnosis Date   Anxiety    Bruise    left shoulder   Calcaneus fracture, right 01/04/2011   Confusion    Depression    Dizziness    Fracture of humerus, proximal, left, open 07/17/2010   repaired 07/21/2010   Frequent falls    sec. 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  Social History: Lives at home with his son who he relies on heavily for ADLs. Extensive history of tobacco use but states that he quit 8 years ago.  Also reports quitting alcohol use about 8 years ago.  States that he does  use marijuana but was unable to quantify how much or how frequently.  Review of Systems: A complete ROS was negative except as per HPI.   Physical Exam: Blood pressure 133/87, pulse (!) 107, temperature (!) 97.5 F (36.4 C), temperature source Oral, resp. rate 19, SpO2 98 %. Physical Exam General: Cachectic, frail and ill appearing elderly gentleman, uncomfortable and agitated HEENT: Normocephalic, atraumatic, EOM intact, conjunctiva normal CV: Regular rate and rhythm, no murmurs rubs or gallops Pulm: Clear to auscultation bilaterally, normal work of breathing Abdomen: Significantly distended, bowel sounds present, diffuse tenderness worse in the right upper quadrant, fluid wave present MSK: Right BKA, 2+ pitting edema in the left lower extremity Skin: Warm and dry, multiple bruises of bilateral upper extremities Neuro: Alert and oriented x3   EKG: personally reviewed my  interpretation is NSR  CXR: personally reviewed my interpretation is multiple pulmonary nodules  CTA Chest IMPRESSION: 1. No CT evidence for acute pulmonary embolus. 2. Enlargement of the pulmonary outflow tract and main pulmonary arteries suggests pulmonary arterial hypertension. 3. Numerous bilateral pulmonary nodules consistent with metastatic disease. 4. Bulky large volume hepatic metastatic disease. Markedly nodular liver contour may be related to the metastatic disease or underlying cirrhosis. 5. Moderate to large volume ascites with peritoneal nodules in the posterior pelvis and splenic flexure of the omentum. Imaging features compatible with metastatic disease. 6. Upper abdominal lymphadenopathy consistent with metastatic disease. 7. Recanalization of the paraumbilical vein suggests portal venous hypertension. 8. Aortic Atherosclerosis (ICD10-I70.0) and Emphysema (ICD10-J43.9).  CT Abdomen Pelvis IMPRESSION: 1. No CT evidence for acute pulmonary embolus. 2. Enlargement of the pulmonary outflow tract and main pulmonary arteries suggests pulmonary arterial hypertension. 3. Numerous bilateral pulmonary nodules consistent with metastatic disease. 4. Bulky large volume hepatic metastatic disease. Markedly nodular liver contour may be related to the metastatic disease or underlying cirrhosis. 5. Moderate to large volume ascites with peritoneal nodules in the posterior pelvis and splenic flexure of the omentum. Imaging features compatible with metastatic disease. 6. Upper abdominal lymphadenopathy consistent with metastatic disease. 7. Recanalization of the paraumbilical vein suggests portal venous hypertension. 8. Aortic Atherosclerosis (ICD10-I70.0) and Emphysema (ICD10-J43.9).  Assessment & Plan by Problem: Principal Problem:   Metastatic disease ([place]) Active Problems:   Intractable pain  Intractable pain 2/2 metastatic cancer Goals of Care Patient presented with  severely diffuse pain, nausea and vomiting secondary to metastatic cancer.  Follows with [facility], no outside records available but requested by EDP. Recently seen at [facility] and was to have home hospice set up.  CT scans in the emergency department showed significant metastatic disease to the lungs, liver with abdominal lymphadenopathy.  Moderate to large volume ascites was also appreciated with peritoneal nodules in the posterior pelvis and splenic flexure of the omentum.  Pain was uncontrolled on IV Dilaudid. Palliative care was consulted while patient and after discussion with family and the patient decided to pursue comfort care. - Palliative consulted, appreciate assistance - Comfort care orders in place - Pain control with IV Dilaudid 1 to 2 mg every hour as needed - Zofran as needed  - Pending residential hospice placement - Consider therapeutic paracentesis  Diet: Regular VTE ppx: Lovenox Code: DNR  Dispo: Admit patient to Observation with expected length of stay less than 2 midnights.  Signed: Mike Craze, DO 11/21/2020, 1:26 PM  Pager: [phone] After 5pm on weekdays and 1pm on weekends: On Call pager: [phone]

## 2020-12-01 NOTE — ED Notes (Signed)
Pt given ice water.

## 2020-12-01 NOTE — ED Notes (Signed)
Pt is now yelling that he feels like he is going to vomit. Provided vomit bags at this time

## 2020-12-01 NOTE — Consult Note (Signed)
Consultation Note Date: 11/30/2020   Patient Name: [name]  DOB: [birth year]  MRN: [id number]  Age / Sex: 60 y.o., male  PCP: Pcp, No Referring Physician: Velna Ochs, MD  Reason for Consultation: Establishing goals of care and Pain control, "metastatic dz, pain control"  HPI/Patient Profile: 60 y.o. male  with past medical history of metastatic cancer, anxiety, substance abuse, and depression presenting from home to ED on 12/11/2020 with intractable pain secondary to his metastatic disease. 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  Patient reported he was told that there was no cure and that he did not have more than a few weeks to live.  He did not receive any chemotherapy or radiation. He went to [facility] yesterday due to severe pain, nausea and vomiting. Stated not much was done and he was discharged home. Patient was admitted on 12/02/2020 for intractable pain secondary to metastatic cancer.   Clinical Assessment and Goals of Care: I have reviewed medical records including EPIC notes, labs, and imaging. Received report from primary RN - no acute concern. Reports patient has been anxious, restless, agitated.    Went to visit patient at bedside - no family/visitors present. Patient was lying in bed awake, alert, oriented, and able to participate in conversation. Patient is in serve pain reporting 10/10 abdominal pain. He also appears anxious. No respiratory distress, increased work of breathing, or secretions noted.   Met with patient  to discuss diagnosis, prognosis, GOC, EOL wishes, disposition, and options.  I introduced Palliative Medicine as specialized medical care for people living with serious illness. It focuses on providing relief from the symptoms and stress of a serious illness. The goal is to improve  quality of life for both the patient and the family.  We discussed a brief life review of the patient as well as functional and nutritional status. He is not married but does have two sons. He reports he lived at home with one of his son's but "he's not home a lot because of work." A lot of our conversation was limited due to patient's repeated request for pain medication and reiteration he was in pain.  We discussed patient's current illness and what it means in the larger context of patient's on-going co-morbidities. He has a clear understanding of what he was previously told about his cancer and poor prognosis. Natural disease trajectory and expectations at EOL were discussed. I attempted to elicit values and goals of care important to the patient. The difference between aggressive medical intervention and comfort care was considered in light of the patient's goals of care. Patient confirms hospice was set to see him this morning - he is not sure what organization he was set up with.  We talked about transition to comfort measures in house and what that would entail inclusive of medications to control pain, dyspnea, agitation, nausea, and itching. We discussed stopping all unnecessary measures such as blood draws,  needle sticks, oxygen, antibiotics, CBGs/insulin, cardiac monitoring, IVF, and frequent vital signs. Patient was agreeable to full comfort measures today and is agreeable for me to call his son to discuss other hospice options.  Called patient's son/[name] - he confirms he lives with patient in a private residence and that hospice was set to come see patient today - he states patient was set up with [facility]. [name] also confirms that he works 9-10hr / day (he's currently at work) and that when he is not present, patient is alone and not able to care for himself. Education provided on residential hospice - he is agreeable for referral to be sent. He understands that [facility]  does not have a facility and is agreeable to switch hospice organizations if needed. He would like patient to stay close to [facility] if possible.  Discussed with patient/family the importance of continued conversation with each other and the medical providers regarding overall plan of care and treatment options, ensuring decisions are within the context of the patient's values and GOCs.    Questions and concerns were addressed. The patient/family was encouraged to call with questions and/or concerns.     Primary Decision Maker PATIENT with assistance from son/[name] [facility]   Initiated full comfort measures Continue DNR/DNI as previously documented - durable DNR form completed and placed in shadow chart. Copy was made and will be scanned into Vynca/ACP tab Patient is not safe to return home as most of his time is spent alone while son works, he is unable to care for himself I do feel he is residential hospice appropriate - son requests facility close to [place], which could be [facility] with [facility] notified and consult placed for : residential hospice referral Added orders for EOL symptom management and to reflect full comfort measures, as well as discontinued orders that were not focused on comfort Unrestricted visitation orders were placed per current [place] EOL visitation policy  Nursing to provide frequent assessments and administer PRN medications as clinically necessary to ensure EOL comfort PMT will continue to follow and support holistically   Code Status/Advance Care Planning: DNR  Palliative Prophylaxis:  Aspiration, Bowel Regimen, Delirium Protocol, Frequent Pain Assessment, Oral Care, and Turn Reposition  Additional Recommendations (Limitations, Scope, Preferences): Full Comfort Care  Psycho-social/Spiritual:  Desire for further Chaplaincy support:no Created space and opportunity for patient and  family to express thoughts and feelings regarding patient's current medical situation.  Emotional support and therapeutic listening provided.  Prognosis:  < 2 weeks  Discharge Planning: Hospice facility      Primary Diagnoses: Present on Admission:  Metastatic disease ([place])   I have reviewed the medical record, interviewed the patient and family, and examined the patient. The following aspects are pertinent.  Past Medical History:  Diagnosis Date   Anxiety    Bruise    left shoulder   Calcaneus fracture, right 01/04/2011   Confusion    Depression    Dizziness    Fracture of humerus, proximal, left, open 07/17/2010   repaired 07/21/2010   Frequent falls    sec. 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  Pneumonia    20+yrs ago   Proximal humerus fracture 07/17/2010   with dislocation, repaired 07/17/2010   Substance abuse ([place])    Social History   Socioeconomic History   Marital status: Divorced    Spouse name: Not on file   Number of children: Not on file   Years of education: Not on file   Highest education level: Not on file  Occupational History   Not on file  Tobacco Use   Smoking status: Every Day    Packs/day: 1.50    Years: 40.00    Pack years: 60.00    Types: Cigarettes   Smokeless tobacco: Not on file  Substance and Sexual Activity   Alcohol use: No    Comment: 42months ago   Drug use: Yes    Types: Marijuana    Comment: last time 36months ago   Sexual activity: Never  Other Topics Concern   Not on file  Social History Narrative   Not on file   Social Determinants of Health   Financial Resource Strain: Not on file  Food Insecurity: Not on file  Transportation Needs: Not on file  Physical Activity: Not on file  Stress: Not on file  Social Connections:  Not on file   Family History  Problem Relation Age of Onset   Anesthesia problems Neg Hx    Hypotension Neg Hx    Malignant hyperthermia Neg Hx    Pseudochol deficiency Neg Hx    Scheduled Meds:  busPIRone  10 mg Oral BID   citalopram  20 mg Oral Daily   enoxaparin (LOVENOX) injection  40 mg Subcutaneous Q24H   gabapentin  100 mg Oral TID   multivitamin with minerals  1 tablet Oral Daily   oxyCODONE-acetaminophen  1 tablet Oral Q6H   Continuous Infusions:  lactated ringers     PRN Meds:.acetaminophen **OR** acetaminophen, HYDROmorphone (DILAUDID) injection, naLOXone (NARCAN)  injection, ondansetron (ZOFRAN) IV **OR** ondansetron, senna-docusate Medications Prior to Admission:  Prior to Admission medications   Medication Sig Start Date End Date Taking? Authorizing Provider  busPIRone (BUSPAR) 10 MG tablet Take 10 mg by mouth 2 (two) times daily. 11/27/20   [provider]  citalopram (CELEXA) 20 MG tablet Take 20 mg by mouth daily.      [provider]  dexamethasone (DECADRON) 4 MG tablet Take 4 mg by mouth daily as needed. 11/27/20   [provider]  furosemide (LASIX) 20 MG tablet Take 20 mg by mouth daily. 11/27/20   [provider]  gabapentin (NEURONTIN) 100 MG capsule Take 100 mg by mouth 3 (three) times daily.      [provider]  Multiple Vitamin (MULTIVITAMIN) tablet Take 1 tablet by mouth daily.      [provider]  ondansetron (ZOFRAN-ODT) 4 MG disintegrating tablet Take 4 mg by mouth every 8 (eight) hours as needed. 11/27/20   [provider]  Oxycodone HCl 20 MG TABS Take by mouth. 11/27/20   [provider]  oxyCODONE-acetaminophen (PERCOCET) 10-325 MG per tablet Take 1-2 tablets by mouth every 4 (four) hours as needed. For pain     [provider]  pantoprazole (PROTONIX) 40 MG tablet Take 40 mg by mouth every 12 (twelve) hours.      [provider]   No Known  Allergies Review of Systems  Constitutional:  Positive for activity change, appetite change and fatigue.  Gastrointestinal:  Positive for nausea.  Neurological:  Positive for weakness.  All other  systems reviewed and are negative.  Physical Exam Vitals and nursing note reviewed.  Constitutional:      General: He is not in acute distress.    Appearance: He is cachectic. He is ill-appearing.  Pulmonary:     Effort: No respiratory distress.  Skin:    General: Skin is warm and dry.  Neurological:     Mental Status: He is alert and oriented to person, place, and time.     Motor: Weakness present.  Psychiatric:        Attention and Perception: Attention normal.        Mood and Affect: Mood is anxious.        Behavior: Behavior is cooperative.        Cognition and Memory: Cognition and memory normal.        Judgment: Judgment is impulsive.    Vital Signs: BP 112/83   Pulse 99   Temp (!) 97.5 F (36.4 C) (Oral)   Resp 19   SpO2 97%  Pain Scale: 0-10   Pain Score: 10-Worst pain ever   SpO2: SpO2: 97 % O2 Device:SpO2: 97 % O2 Flow Rate: .   IO: Intake/output summary: No intake or output data in the 24 hours ending 12/04/2020 1114  LBM:   Baseline Weight:   Most recent weight:       Palliative Assessment/Data: PPS 10-20%     Time In: 1130 Time Out: 1245 Time Total: 75 minutes  Greater than 50%  of this time was spent counseling and coordinating care related to the above assessment and plan.  Signed by: Lin Landsman, NP   Please contact Palliative Medicine Team phone at [phone] for questions and concerns.  For individual provider: See Shea Evans

## 2020-12-01 NOTE — ED Notes (Signed)
Pt given sprite per request 

## 2020-12-01 NOTE — ED Notes (Signed)
[name], son, [phone]

## 2020-12-01 NOTE — ED Notes (Signed)
Pt yelling out that he needs something to drink and wants some water

## 2020-12-01 NOTE — ED Notes (Signed)
Pt yelling out requesting to be placed in another area and another bed. Advised at this time that I do not have anywhere else to put him or any other bed to place him

## 2020-12-01 NOTE — ED Notes (Signed)
Called to order a hospital bed

## 2020-12-01 NOTE — ED Provider Notes (Signed)
[facility] EMERGENCY DEPARTMENT Provider Note   CSN: [id number] Arrival date & time: 12/10/2020  0350     History Chief Complaint  Patient presents with   Generalized Body Aches    [name] is a 60 y.o. male.  Patient brought in by EMS with diffuse pain and nausea for the past 2 days.  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  He was told he only has a few weeks to live.  He did not receive any chemo or radiation and states that it is not curable.  He states he is taking OxyContin and oxycodone at home for pain but he is out over the past 2 days.  He is complaining of pain all over as well as nausea but no vomiting.  He was apparently seen at [facility] earlier tonight but "they did not do anything for me".  He states that did not check any blood work or do any testing. Patient is here complaining of pain all over, feeling dehydrated, having nausea and poor p.o. intake.  Denies any fevers.  Does feel some shortness of breath.  No chest pain.  No cough, runny nose or sore throat.  The history is provided by the patient and the EMS personnel. The history is limited by the condition of the patient.      Past Medical History:  Diagnosis Date   Anxiety    Bruise    left shoulder   Calcaneus fracture, right 01/04/2011   Confusion    Depression    Dizziness    Fracture of humerus, proximal, left, open 07/17/2010   repaired 07/21/2010   Frequent falls    sec. to alcohol abuse   Gastric ulcer    GERD (gastroesophageal reflux disease)    takes Protonix daily   GI bleed    H/O: GI bleed    Headache(784.0)    Insomnia    Kidney stones    hx of   Osteomyelitis of right foot ([place]) 01/04/2011   Pneumonia    20+yrs ago   Proximal humerus fracture 07/17/2010   with dislocation, repaired 07/17/2010   Substance abuse ([place])     Patient Active Problem List   Diagnosis Date Noted    Calcaneus fracture, right 01/04/2011   Osteomyelitis of right foot ([place] ) 01/04/2011   H/O: GI bleed    Fracture of humerus, proximal, left, open    GI bleed    Depression    Substance abuse ([place])    Anemia associated with acute blood loss, postoperative    Postoperative infection, left shoulder hemiarthroplasty 08/04/2010    Past Surgical History:  Procedure Laterality Date   FRACTURE SURGERY  07/17/2010   ORIF left proximal humerus dislocation   HEEL SPUR SURGERY     right   I & D EXTREMITY  01/02/2011   Procedure: IRRIGATION AND DEBRIDEMENT EXTREMITY;  Surgeon: Johnny Bridge;  Location: [place];  Service: Orthopedics;  Laterality: Right;  I&D Right Calcaneus with Removal of Plate and Screws; wound vac Placement   SHOULDER SURGERY     left x 2        Family History  Problem Relation Age of Onset   Anesthesia problems Neg Hx    Hypotension Neg Hx    Malignant hyperthermia Neg Hx    Pseudochol deficiency Neg Hx     Social History   Tobacco Use  Smoking status: Every Day    Packs/day: 1.50    Years: 40.00    Pack years: 60.00    Types: Cigarettes  Substance Use Topics   Alcohol use: No    Comment: 19months ago   Drug use: Yes    Types: Marijuana    Comment: last time 63months ago    Home Medications Prior to Admission medications   Medication Sig Start Date End Date Taking? Authorizing Provider  busPIRone (BUSPAR) 10 MG tablet Take 10 mg by mouth 2 (two) times daily. 11/27/20   [provider]  citalopram (CELEXA) 20 MG tablet Take 20 mg by mouth daily.      [provider]  dexamethasone (DECADRON) 4 MG tablet Take 4 mg by mouth daily as needed. 11/27/20   [provider]  dextrose 5 % SOLN 50 mL with ceFEPIme 2 G SOLR 2 g Inject 2 g into the vein every 8 (eight) hours. 01/06/11   Truman Hayward, MD  furosemide (LASIX) 20 MG tablet Take 20 mg by mouth daily. 11/27/20   [provider]  gabapentin (NEURONTIN) 100 MG capsule  Take 100 mg by mouth 3 (three) times daily.      [provider]  Multiple Vitamin (MULTIVITAMIN) tablet Take 1 tablet by mouth daily.      [provider]  ondansetron (ZOFRAN-ODT) 4 MG disintegrating tablet Take 4 mg by mouth every 8 (eight) hours as needed. 11/27/20   [provider]  Oxycodone HCl 20 MG TABS Take by mouth. 11/27/20   [provider]  oxyCODONE-acetaminophen (PERCOCET) 10-325 MG per tablet Take 1-2 tablets by mouth every 4 (four) hours as needed. For pain     [provider]  pantoprazole (PROTONIX) 40 MG tablet Take 40 mg by mouth every 12 (twelve) hours.      [provider]    Allergies    Patient has no known allergies.  Review of Systems   Review of Systems  Constitutional:  Positive for activity change, appetite change and fatigue. Negative for fever.  HENT:  Negative for congestion and rhinorrhea.   Eyes:  Negative for visual disturbance.  Respiratory:  Positive for shortness of breath.   Gastrointestinal:  Positive for abdominal pain and nausea. Negative for vomiting.  Musculoskeletal:  Positive for arthralgias and myalgias. Negative for back pain and neck pain.  Skin:  Negative for rash.  Neurological:  Positive for weakness. Negative for dizziness and headaches.   all other systems are negative except as noted in the HPI and PMH.   Physical Exam Updated Vital Signs BP 133/85 (BP Location: Right Arm)   Pulse 95   Temp (!) 97.5 F (36.4 C) (Oral)   Resp 18   SpO2 95%   Physical Exam Vitals and nursing note reviewed.  Constitutional:      General: He is not in acute distress.    Appearance: He is well-developed. He is ill-appearing.     Comments: Cachectic, chronically ill appearing  HENT:     Head: Normocephalic and atraumatic.     Mouth/Throat:     Mouth: Mucous membranes are dry.     Pharynx: No oropharyngeal exudate.  Eyes:     Conjunctiva/sclera: Conjunctivae normal.     Pupils: Pupils  are equal, round, and reactive to light.  Neck:     Comments: No meningismus. Cardiovascular:     Rate and Rhythm: Normal rate and regular rhythm.     Heart sounds: Normal  heart sounds. No murmur heard. Pulmonary:     Effort: Pulmonary effort is normal. No respiratory distress.     Breath sounds: Normal breath sounds.  Abdominal:     General: There is distension.     Palpations: Abdomen is soft.     Tenderness: There is abdominal tenderness. There is guarding. There is no rebound.     Comments: Caput medusa present, diffuse tenderness with voluntary guarding throughout.  Positive fluid wave  Musculoskeletal:        General: No tenderness. Normal range of motion.     Cervical back: Normal range of motion and neck supple.     Comments: Cachectic extremities.  Right BKA.  Chronic deformity to left shoulder  Skin:    General: Skin is warm.  Neurological:     Mental Status: He is alert and oriented to person, place, and time.     Cranial Nerves: No cranial nerve deficit.     Motor: No abnormal muscle tone.     Coordination: Coordination normal.     Comments:  5/5 strength throughout. CN 2-12 intact.Equal grip strength.   Psychiatric:        Behavior: Behavior normal.    ED Results / Procedures / Treatments   Labs (all labs ordered are listed, but only abnormal results are displayed) Labs Reviewed  CBC WITH DIFFERENTIAL/PLATELET - Abnormal; Notable for the following components:      Result Value   WBC 24.6 (*)    RDW 18.2 (*)    Neutro Abs 22.6 (*)    Lymphs Abs 0.5 (*)    Monocytes Absolute 1.3 (*)    Abs Immature Granulocytes 0.14 (*)    All other components within normal limits  COMPREHENSIVE METABOLIC PANEL - Abnormal; Notable for the following components:   Chloride 97 (*)    Glucose, Bld 112 (*)    BUN 46 (*)    Calcium 11.3 (*)    Albumin 2.7 (*)    AST 94 (*)    Alkaline Phosphatase 263 (*)    Total Bilirubin 1.7 (*)    All other components within normal limits   LIPASE, BLOOD  AMMONIA  LACTIC ACID, PLASMA  PROTIME-INR  URINALYSIS, ROUTINE W REFLEX MICROSCOPIC  LACTIC ACID, PLASMA    EKG EKG Interpretation  Date/Time:  Thursday December 01 2020 04:36:18 EST Ventricular Rate:  91 PR Interval:  136 QRS Duration: 100 QT Interval:  378 QTC Calculation: 466 R Axis:   83 Text Interpretation: Sinus rhythm Right atrial enlargement Borderline right axis deviation Borderline repolarization abnormality Nonspecific T wave abnormality Confirmed by Ezequiel Essex [id number]) on 11/16/2020 4:43:06 AM  Radiology DG Chest Portable 1 View  Result Date: 12/11/2020 CLINICAL DATA:  60 year old male with weakness.  Body ache. EXAM: PORTABLE CHEST 1 VIEW COMPARISON:  Chest radiographs 05/21/2012 and earlier. FINDINGS: Portable AP supine view at 0422 hours. Chronic severe deformity of the left humerus with ORIF hardware removal since 2014. Disarticulated left shoulder. Chronic large lung volumes centrilobular emphysema on a 2013 CT. Mediastinal contours remain within normal limits. Visualized tracheal air column is within normal limits. Multiple small 8-9 mm bilateral pulmonary nodules in the mid lungs. No superimposed pneumothorax, pulmonary edema, pleural effusion or consolidation. Chronic right 8 rib deformity. But in the right lower lung the larger roughly 2.7 cm pulmonary nodule is suspected just above the diaphragm. Chronic left lateral 4th and 5th rib fractures. No acute osseous abnormality identified. IMPRESSION: 1. Chronic Emphysema (ICD10-J43.9) with evidence of multiple  bilateral pulmonary nodules, new since 2014. Recommend Chest CT (IV contrast preferred) to further characterize. 2. No other acute cardiopulmonary abnormality. 3. Chronic bone deformities, including removal of left humerus ORIF hardware and disarticulated left shoulder since 2014. Electronically Signed   By: Genevie Ann M.D.   On: 12/02/2020 04:49    Procedures Procedures   Medications Ordered in  ED Medications  sodium chloride 0.9 % bolus 1,000 mL (has no administration in time range)  sodium chloride 0.9 % bolus 1,000 mL (has no administration in time range)  HYDROmorphone (DILAUDID) injection 1 mg (has no administration in time range)  ondansetron (ZOFRAN) injection 4 mg (has no administration in time range)    ED Course  I have reviewed the triage vital signs and the nursing notes.  Pertinent labs & imaging results that were available during my care of the patient were reviewed by me and considered in my medical decision making (see chart for details).    MDM Rules/Calculators/A&P                          Patient with a history of reportedly terminal cancer here with intractable pain and nausea.  Vital stable, no distress.  No records available in Care Everywhere  Patient given IV fluids and symptom control.  Labs show leukocytosis.  Patient does have Decadron on his medication list but is not sure what he is taking.  Labs also show hypercalcemia.  Pulmonary nodules on Xray, likely cancerous. No imaging in system. No records available.  CT to be obtained for further evaluation of disease as well as r/o acute pathology.   Plan likely admission for symptom control, palliative care consult. Care transferred to Dr. Pearline Cables at shift change. Outside records still pending.  Final Clinical Impression(s) / ED Diagnoses Final diagnoses:  None    Rx / DC Orders ED Discharge Orders     None        Azzure Garabedian, Annie Main, MD 12/08/2020 [phone]

## 2020-12-01 NOTE — Care Management (Incomplete)
ED CM received  call from [facility], they are not able to accept patients for residential hospice. Patient will continue with [facility] and son [name] updated as per

## 2020-12-01 NOTE — Progress Notes (Signed)
CSW contacted patients and made him aware of case management contacting [facility] for residential hospice. Patients son stated he will keep his phone on him.

## 2020-12-02 DIAGNOSIS — G893 Neoplasm related pain (acute) (chronic): Secondary | ICD-10-CM | POA: Diagnosis present

## 2020-12-02 DIAGNOSIS — Z681 Body mass index (BMI) 19 or less, adult: Secondary | ICD-10-CM | POA: Diagnosis not present

## 2020-12-02 DIAGNOSIS — F32A Depression, unspecified: Secondary | ICD-10-CM | POA: Diagnosis present

## 2020-12-02 DIAGNOSIS — R188 Other ascites: Secondary | ICD-10-CM | POA: Diagnosis present

## 2020-12-02 DIAGNOSIS — F419 Anxiety disorder, unspecified: Secondary | ICD-10-CM | POA: Diagnosis present

## 2020-12-02 DIAGNOSIS — Z20822 Contact with and (suspected) exposure to covid-19: Secondary | ICD-10-CM | POA: Diagnosis present

## 2020-12-02 DIAGNOSIS — Z515 Encounter for palliative care: Secondary | ICD-10-CM | POA: Diagnosis not present

## 2020-12-02 DIAGNOSIS — Z79899 Other long term (current) drug therapy: Secondary | ICD-10-CM | POA: Diagnosis not present

## 2020-12-02 DIAGNOSIS — C799 Secondary malignant neoplasm of unspecified site: Secondary | ICD-10-CM

## 2020-12-02 DIAGNOSIS — C787 Secondary malignant neoplasm of liver and intrahepatic bile duct: Secondary | ICD-10-CM | POA: Diagnosis present

## 2020-12-02 DIAGNOSIS — Z66 Do not resuscitate: Secondary | ICD-10-CM | POA: Diagnosis present

## 2020-12-02 DIAGNOSIS — C801 Malignant (primary) neoplasm, unspecified: Secondary | ICD-10-CM | POA: Diagnosis present

## 2020-12-02 DIAGNOSIS — J439 Emphysema, unspecified: Secondary | ICD-10-CM | POA: Diagnosis present

## 2020-12-02 DIAGNOSIS — R54 Age-related physical debility: Secondary | ICD-10-CM | POA: Diagnosis present

## 2020-12-02 DIAGNOSIS — F17210 Nicotine dependence, cigarettes, uncomplicated: Secondary | ICD-10-CM | POA: Diagnosis present

## 2020-12-02 DIAGNOSIS — R64 Cachexia: Secondary | ICD-10-CM | POA: Diagnosis present

## 2020-12-02 DIAGNOSIS — R109 Unspecified abdominal pain: Secondary | ICD-10-CM | POA: Diagnosis not present

## 2020-12-02 DIAGNOSIS — R112 Nausea with vomiting, unspecified: Secondary | ICD-10-CM | POA: Diagnosis not present

## 2020-12-02 DIAGNOSIS — K219 Gastro-esophageal reflux disease without esophagitis: Secondary | ICD-10-CM | POA: Diagnosis present

## 2020-12-02 DIAGNOSIS — C786 Secondary malignant neoplasm of retroperitoneum and peritoneum: Secondary | ICD-10-CM | POA: Diagnosis present

## 2020-12-02 DIAGNOSIS — Z8711 Personal history of peptic ulcer disease: Secondary | ICD-10-CM | POA: Diagnosis not present

## 2020-12-02 DIAGNOSIS — C7801 Secondary malignant neoplasm of right lung: Secondary | ICD-10-CM | POA: Diagnosis present

## 2020-12-02 DIAGNOSIS — I700 Atherosclerosis of aorta: Secondary | ICD-10-CM | POA: Diagnosis present

## 2020-12-02 DIAGNOSIS — Z79891 Long term (current) use of opiate analgesic: Secondary | ICD-10-CM | POA: Diagnosis not present

## 2020-12-02 DIAGNOSIS — C7802 Secondary malignant neoplasm of left lung: Secondary | ICD-10-CM | POA: Diagnosis present

## 2020-12-02 DIAGNOSIS — G4700 Insomnia, unspecified: Secondary | ICD-10-CM | POA: Diagnosis present

## 2020-12-02 DIAGNOSIS — F1010 Alcohol abuse, uncomplicated: Secondary | ICD-10-CM | POA: Diagnosis present

## 2020-12-02 DIAGNOSIS — Z87442 Personal history of urinary calculi: Secondary | ICD-10-CM | POA: Diagnosis not present

## 2020-12-02 LAB — HIV ANTIBODY (ROUTINE TESTING W REFLEX): HIV Screen 4th Generation wRfx: NONREACTIVE

## 2020-12-02 MED ORDER — HYDROMORPHONE BOLUS VIA INFUSION
1.0000 mg | INTRAVENOUS | Status: DC | PRN
Start: 2020-12-02 — End: 2020-12-03
  Filled 2020-12-02: qty 3

## 2020-12-02 MED ORDER — SODIUM CHLORIDE 0.9 % IV SOLN
2.0000 mg/h | INTRAVENOUS | Status: DC
  Administered 2020-12-02: 2 mg/h via INTRAVENOUS
  Filled 2020-12-02: qty 5

## 2020-12-02 NOTE — Hospital Course (Signed)
11/19 Patient is in a lot of pain "all over" Pain medicine is helping a little Does not want procedure to drain fluid

## 2020-12-02 NOTE — Progress Notes (Addendum)
   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  Overnight Events: Patient vomited x1 overnight. 200 mL of yellow fluid. Given PRN Zofran.   Interim History:  The patient is seen and assessed at bedside this morning. He is cachectic and ill-appearing and is in obvious discomfort. He reports the pain is "all over". He reports the PRN pain medication is not helping adequately. He indicates that he does not want a therapeutic paracentesis.   OBJECTIVE:  Vital Signs: Vitals:   12/09/2020 1447 11/29/2020 2025 12/02/20 0041 12/02/20 0221  BP: (!) 173/74 (!) 165/86 140/75 (!) 159/78  Pulse: 85 89 (!) 113 (!) 109  Resp: 15 17 13 17   Temp:  97.9 F (36.6 C) 97.9 F (36.6 C) 97.6 F (36.4 C)  TempSrc:    Axillary  SpO2: 99% 98% 90% (!) 89%   Supplemental O2: Room Air SpO2: (!) 89 %   No intake or output data in the 24 hours ending 12/02/20 1144 Net IO Since Admission: No IO data has been entered for this period [12/02/20 1144]  Physical Exam: Physical Exam Vitals reviewed.  Constitutional:      Appearance: He is ill-appearing.     Comments: Cachectic appearing  Cardiovascular:     Rate and Rhythm: Normal rate and regular rhythm.     Pulses: Normal pulses.     Heart sounds: No murmur heard. Pulmonary:     Breath sounds: Normal breath sounds.  Abdominal:     Comments: Firm and protuberant abdomen, audible bowel sounds, diffusely TTP  Neurological:     Mental Status: He is alert.    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  Plan: #Intractable pain, N/V 2/2 metastatic cancer #End of life care Diffuse / advanced metastatic disease confirmed with CT C/A/P. Has large volume ascites, carcinomatosis, liver and lung mets. Palliative consulted. Patient is now comfort care. Still in significant pain despite Dilaudid PRN. Pain regimen has been changed as follows. -Dilaudid infusion 2-5 mg/hr -Dilaudid 1-3 mg IV q15 min PRN pain/dyspnea -Ativan 1 mg PRN anxiety -Zofran 4 mg q6hrs PRN nausea -Haldol PRN agitation -Glycopyrrolate PRN secretions   Best Practice: Diet: Regular diet IVF: Fluids: none VTE: no VTE ppx given limited life expectancy Code: [place], DNR Therapy Recs: None, DME: none Family Contact: Romero Letizia, [phone] DISPO: Anticipated discharge tomorrow to  residential hospice  pending placement (possibly [facility])  Corky Sox, MD PGY-1 Pager: [phone]

## 2020-12-02 NOTE — ED Notes (Signed)
Attempted to call 5n to give report since no purple man and there was no answer

## 2020-12-02 NOTE — Progress Notes (Addendum)
Daily Progress Note   Patient Name: [name]       Date: 12/02/2020 DOB: January 08, 1961  Age: 60 y.o. MRN#: [id number] Attending Physician: No att. providers found Primary Care Physician: Pcp, No Admit Date: 11/26/2020  Reason for Consultation/Follow-up: Disposition, Non pain symptom management, Pain control, Psychosocial/spiritual support, and Terminal Care  Subjective: Received notification from Dr. Alvie Heidelberg that patient is in significant pain this morning. Chart review performed - noted frequent PRNs given for pain. Received report from primary RN - no acute concerns other than frequent requests for pain. RN states patient is not eating/drinking other than taking pills.  Went to visit patient at bedside - no family/visitors present. Patient is lying in bed - he is lethargic - he does wake to voice/gentle touch. He is chronically ill and frail appearing, abdomen is distended. Non-verbal gestures of pain and discomfort noted - he reports his abdominal pain as 10/10 with nausea. No respiratory distress, increased work of breathing, or secretions noted.   Was met at bedside by [facility] - informed that he was not approved for residential hospice with [name] with liaison over the phone and requested re-evaluation - they will have RN call me back to discuss case.   Reviewed symptom management plan with primary RN. Requested administration of antiemetic.   12:00PM Spoke with hospice RN to discuss case. Reviewed assessment from this morning. I was informed patient was not approved due to concern he will require medications their pharmacy is not able to provide at this time for symptom management. This morning, patient's pain was not managed well on PRN doses and dilaudid drip was  started - currently, their pharmacy can support patient on dilaudid drip if it is enough for adequate pain management; however, they want to ensure he is more comfortable on this regimen before accepting him. Also, was informed he needs to not be eating/drinking before he's accepted - notified her patient is not eating/drinking outside of swallowing pills. They will re-assess him tomorrow.  Provided updates to interdisciplinary team. Informed [name] that if son is agreeable, could send referral to other residential hospice organizations for evaluation.   Length of Stay: 0  Current Medications: Scheduled Meds:   antiseptic oral rinse  15 mL Topical BID   busPIRone  10 mg Oral BID  citalopram  20 mg Oral Daily   gabapentin  100 mg Oral TID    Continuous Infusions:  HYDROmorphone      PRN Meds: acetaminophen **OR** acetaminophen, glycopyrrolate **OR** glycopyrrolate **OR** glycopyrrolate, haloperidol **OR** haloperidol **OR** haloperidol lactate, HYDROmorphone, LORazepam **OR** LORazepam **OR** LORazepam, ondansetron **OR** ondansetron (ZOFRAN) IV, polyvinyl alcohol, senna-docusate  Physical Exam Vitals and nursing note reviewed.  Constitutional:      General: He is not in acute distress.    Appearance: He is ill-appearing.  Pulmonary:     Effort: No respiratory distress.  Abdominal:     General: There is distension.  Skin:    General: Skin is warm and dry.  Neurological:     Mental Status: Mental status is at baseline. He is lethargic.     Motor: Weakness present.  Psychiatric:        Mood and Affect: Mood is anxious.        Judgment: Judgment is impulsive.            Vital Signs: BP (!) 159/78 (BP Location: Left Leg)   Pulse (!) 109   Temp 97.6 F (36.4 C) (Axillary)   Resp 17   SpO2 (!) 89%  SpO2: SpO2: (!) 89 % O2 Device: O2 Device: Room Air O2 Flow Rate:    Intake/output summary: No intake or output data in the 24 hours ending 12/02/20 1032 LBM:   Baseline Weight:    Most recent weight:         Palliative Assessment/Data: PPS 10%    Flowsheet Rows    Flowsheet Row Most Recent Value  Intake Tab   Referral Department Hospitalist  Unit at Time of Referral ER  Palliative Care Primary Diagnosis Cancer  Date Notified 11/24/2020  Palliative Care Type New Palliative care  Reason for referral Clarify Goals of Care, Pain  Date of Admission 11/29/2020  Date first seen by Palliative Care 11/23/2020  # of days Palliative referral response time 0 Day(s)  # of days IP prior to Palliative referral 0  Clinical Assessment   Psychosocial & Spiritual Assessment   Palliative Care Outcomes   Patient/Family meeting held? Yes  Who was at the meeting? patient, son  Palliative Care Outcomes Improved pain interventions, Improved non-pain symptom therapy, Clarified goals of care, Counseled regarding hospice, Provided end of life care assistance, Provided psychosocial or spiritual support, Changed to focus on comfort, Completed durable DNR, Transitioned to hospice  Patient/Family wishes: Interventions discontinued/not started  Mechanical Ventilation, Antibiotics, Tube feedings/TPN, BiPAP, Hemodialysis, NIPPV, Trach, Transfusion, Vasopressors, PEG, Transfer out of ICU       Patient Active Problem List   Diagnosis Date Noted   Metastatic disease ([place]) 11/16/2020   Intractable pain 11/28/2020   Calcaneus fracture, right 01/04/2011   Osteomyelitis of right foot ([place]) 01/04/2011   H/O: GI bleed    Fracture of humerus, proximal, left, open    GI bleed    Depression    Substance abuse ([place])    Anemia associated with acute blood loss, postoperative    Postoperative infection, left shoulder hemiarthroplasty 08/04/2010    Palliative Care Assessment & Plan   Patient Profile: 60 y.o. male  with past medical history of metastatic cancer, anxiety, substance abuse, and depression presenting from home to ED on 12/12/2020 with intractable pain secondary to his metastatic disease.  Patient reported to ED provider that he was diagnosed with metastatic cancer to his lungs and liver 5 weeks ago at [facility].  Patient reported he was told  that there was no cure and that he did not have more than a few weeks to live.  He did not receive any chemotherapy or radiation. He went to [facility] yesterday due to severe pain, nausea and vomiting. Stated not much was done and he was discharged home. Patient was admitted on 11/30/2020 for intractable pain secondary to metastatic cancer.   Assessment: Metastatic disease Intractable pain Terminal care  Recommendations/Plan Continue full comfort measures Continue DNR/DNI as previously documented Dilaudid drip initiated Continue PO medications as tolerated or until he cannot take anymore, as they can be considered comfort medications [facility] will not accept patient until his symptoms are better controlled - they want to ensure they can meet his needs once transferred due to pharmacy limitations [name] to speak with son about other hospice organizations  Nursing to provide frequent assessments and administer PRN medications as clinically necessary to ensure EOL comfort PMT will continue to follow and support holistically  Goals of Care and Additional Recommendations: Limitations on Scope of Treatment: Full Comfort Care  Code Status:    Code Status Orders  (From admission, onward)           Start     Ordered   12/05/2020 1214  Do not attempt resuscitation (DNR)  Continuous       Question Answer Comment  In the event of cardiac or respiratory ARREST Do not call a "code blue"   In the event of cardiac or respiratory ARREST Do not perform Intubation, CPR, defibrillation or ACLS   In the event of cardiac or respiratory ARREST Use medication by any route, position, wound care, and other measures to relive pain and suffering. May use oxygen, suction and manual treatment of airway obstruction as needed for comfort.       11/20/2020 1217           Code Status History     Date Active Date Inactive Code Status Order ID Comments User Context   11/20/2020 1036 11/17/2020 1217 DNR [id number]  Mike Craze, DO ED       Prognosis:  < 2 weeks  Discharge Planning: Hospice facility  Care plan was discussed with primary RN, Dr. Alvie Heidelberg, [facility], hospice RN  Thank you for allowing the Palliative Medicine Team to assist in the care of this patient.   Total Time 55 minutes Prolonged Time Billed  no      Greater than 50%  of this time was spent counseling and coordinating care related to the above assessment and plan.  Lin Landsman, NP  Please contact Palliative Medicine Team phone at [phone] for questions and concerns.

## 2020-12-02 NOTE — Progress Notes (Signed)
NCM f/u with [facility] regarding approval status for residential hospice. NCM spoke with Bryson Ha and Bryson Ha informed NCM pt was not approved for residential hospice, however, after speaking with [facility] Palliative liaison she will have their nurse to re-evaluate. [name] will continue to monitor and assist with needs..... Whitman Hero RN,BSN,CM [phone]

## 2020-12-15 NOTE — Progress Notes (Signed)
Upon rounding and examination the patient did not respond to verbal or physical stimuli. Absent heart and breath sounds. Absent pulses. Pupils are fixed and dilated. Patient pronounced dead at 70. Resident with internal medicine notified- Chancy Milroy at [phone]. Attempted to notify next of kin Breydan Shillingburg at [phone] unsuccessfully. Also attempted to call Drayke Grabel listed at [phone], unsuccessful.  [facility] notified of dealth. Will complete eye prep with saline gauze.

## 2020-12-15 NOTE — Progress Notes (Signed)
Wasted remaining Dilaudid infusion in stericycle 37.5 ml- confirmed with charge nurse Loraine Maple, RN.

## 2020-12-15 NOTE — Progress Notes (Signed)
I received a page from nurse Baldemar Friday regarding [name]. Rothbauer's death at 1:19am. His time of death noted by the Baldemar Friday was 1:04am and this was confirmed by the charge nurse. The nursing staff attempted to notify the family but the attempts were unsuccessful. I attempted to call the family but was unsuccessful in reaching the family.   Idamae Schuller, MD Tillie Rung. [facility] Internal Medicine Residency, PGY-1

## 2020-12-15 NOTE — Progress Notes (Signed)
Now successful in reaching next of kin, Ferdinando Lodge to notify of patient death. Quillian Quince appreciative if notification

## 2020-12-15 DEATH — deceased

## 2021-01-15 NOTE — Death Summary Note (Signed)
°  Name: [name] MRN: [id number] DOB: 1960/06/23 61 y.o.  Date of Admission: 11/29/2020  3:50 AM Date of Discharge: 01/04/2021 Attending Physician: Dr. Velna Ochs  Discharge Diagnosis: Principal Problem:   Metastatic disease [facility]) Active Problems:   Intractable pain   Metastatic cancer ([place])   End of life care   Cause of death: metastatic cancer Time of death: 1:04 AM, 12-12-2020  Disposition and follow-up:   [name] was discharged from [facility] in expired condition.    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 CT chest/abdomen/pelvis was performed and confirmed extensive mets to liver and lung. Physical exam was notable for large volume ascites, for which he refused a therapeutic paracentesis. He was confirmed to be comfort care with a DNR. He was started on Dilaudid infusion 2-5 mg/hr with extra PRN and Ativan for anxiety. He was noted by the nurse to be apneic and his death was confirmed at 1:04 AM with a death exam performed by the nurse. Family was notified and the appropriate arrangements were made.   Signed: Corky Sox, MD PGY-1
